# Patient Record
Sex: Male | Born: 1980 | Race: White | Hispanic: No | Smoking: Current every day smoker
Health system: Southern US, Community
[De-identification: ages and names within clinical notes are randomized; demographics above are authoritative.]

## PROBLEM LIST (undated history)

## (undated) DIAGNOSIS — R569 Unspecified convulsions: Secondary | ICD-10-CM

## (undated) DIAGNOSIS — I10 Essential (primary) hypertension: Secondary | ICD-10-CM

## (undated) DIAGNOSIS — C801 Malignant (primary) neoplasm, unspecified: Secondary | ICD-10-CM

## (undated) DIAGNOSIS — R51 Headache: Secondary | ICD-10-CM

## (undated) DIAGNOSIS — M24419 Recurrent dislocation, unspecified shoulder: Secondary | ICD-10-CM

## (undated) HISTORY — DX: Headache: R51

## (undated) HISTORY — PX: BRAIN TUMOR EXCISION: SHX577

## (undated) HISTORY — PX: ABDOMINAL SURGERY: SHX537

---

## 2001-03-02 ENCOUNTER — Emergency Department (HOSPITAL_COMMUNITY): Admission: EM | Admit: 2001-03-02 | Discharge: 2001-03-02 | Payer: Self-pay | Admitting: Emergency Medicine

## 2001-03-02 ENCOUNTER — Encounter: Payer: Self-pay | Admitting: Emergency Medicine

## 2006-12-12 ENCOUNTER — Emergency Department (HOSPITAL_COMMUNITY): Admission: EM | Admit: 2006-12-12 | Discharge: 2006-12-12 | Payer: Self-pay | Admitting: Emergency Medicine

## 2011-09-08 ENCOUNTER — Encounter (HOSPITAL_COMMUNITY): Payer: Self-pay | Admitting: Emergency Medicine

## 2011-09-08 ENCOUNTER — Emergency Department (HOSPITAL_COMMUNITY)
Admission: EM | Admit: 2011-09-08 | Discharge: 2011-09-08 | Disposition: A | Discharge status: Home / Self Care | Payer: Self-pay | Attending: Emergency Medicine | Admitting: Emergency Medicine

## 2011-09-08 DIAGNOSIS — I10 Essential (primary) hypertension: Secondary | ICD-10-CM | POA: Insufficient documentation

## 2011-09-08 DIAGNOSIS — M62838 Other muscle spasm: Secondary | ICD-10-CM | POA: Insufficient documentation

## 2011-09-08 DIAGNOSIS — Z85841 Personal history of malignant neoplasm of brain: Secondary | ICD-10-CM | POA: Insufficient documentation

## 2011-09-08 DIAGNOSIS — F17200 Nicotine dependence, unspecified, uncomplicated: Secondary | ICD-10-CM | POA: Insufficient documentation

## 2011-09-08 DIAGNOSIS — M25519 Pain in unspecified shoulder: Secondary | ICD-10-CM | POA: Insufficient documentation

## 2011-09-08 HISTORY — DX: Recurrent dislocation, unspecified shoulder: M24.419

## 2011-09-08 HISTORY — DX: Unspecified convulsions: R56.9

## 2011-09-08 HISTORY — DX: Essential (primary) hypertension: I10

## 2011-09-08 HISTORY — DX: Malignant (primary) neoplasm, unspecified: C80.1

## 2011-09-08 MED ORDER — HYDROCODONE-ACETAMINOPHEN 5-325 MG PO TABS
1.0000 | ORAL_TABLET | Freq: Once | ORAL | Status: AC
  Administered 2011-09-08: 1 via ORAL
  Filled 2011-09-08: qty 1

## 2011-09-08 MED ORDER — IBUPROFEN 800 MG PO TABS: 800.0000 mg | ORAL_TABLET | Freq: Three times a day (TID) | ORAL | Status: AC | PRN

## 2011-09-08 MED ORDER — HYDROCODONE-ACETAMINOPHEN 5-325 MG PO TABS: ORAL_TABLET | ORAL | Status: AC

## 2011-09-08 MED ORDER — CYCLOBENZAPRINE HCL 10 MG PO TABS: 10.0000 mg | ORAL_TABLET | Freq: Two times a day (BID) | ORAL | Status: AC | PRN

## 2011-09-08 MED ORDER — CYCLOBENZAPRINE HCL 10 MG PO TABS
10.0000 mg | ORAL_TABLET | Freq: Once | ORAL | Status: AC
  Administered 2011-09-08: 10 mg via ORAL
  Filled 2011-09-08: qty 1

## 2011-09-08 NOTE — ED Notes (Signed)
Patient states that he is having left shoulder pain. States he feels a knot in left shoulder. Also states he has a shooting pain into neck and down back. States has dislocated same shoulder multiple times before from seizures. Reports last seizure was yesterday.

## 2011-09-08 NOTE — ED Provider Notes (Signed)
History     CSN: [id number]  Arrival date & time 09/08/11  1901   First MD Initiated Contact with Patient 09/08/11 1921      Chief Complaint  Patient presents with  . Shoulder Pain    (Consider location/radiation/quality/duration/timing/severity/associated sxs/prior treatment) HPI Comments: Patient working outside approx 3 hrs ago -- noted to have soreness in left anterior chest which worsened and developed a 'knot'. Patient has h/o shoulder dislocations and was worried. Pain radiates to left posterior neck and shoulder. No vision change. No extremity weakness or tingling. Pain is worsened with movement. It feels like a 'crick in his neck but in my shoulder". No treatments prior. Nothing makes symptoms better. Onset acute.   Patient is a 31 y.o. male presenting with shoulder pain. The history is provided by the patient.  Shoulder Pain This is a new problem. The current episode started today. The problem occurs constantly. The problem has been unchanged. Associated symptoms include chest pain (Chest wall pain) and neck pain. Pertinent negatives include no headaches, numbness or weakness. Exacerbated by: movement. He has tried nothing for the symptoms.    Past Medical History  Diagnosis Date  . Seizures   . Cancer     brain tumor  . Shoulder dislocation, recurrent   . Hypertension     Past Surgical History  Procedure Date  . Brain tumor excision   . Abdominal surgery     at age of [age]    History reviewed. No pertinent family history.  History  Substance Use Topics  . Smoking status: Current Everyday Smoker  . Smokeless tobacco: Not on file  . Alcohol Use: Yes     occasional      Review of Systems  Constitutional: Negative for activity change.  HENT: Positive for neck pain.   Respiratory: Negative for shortness of breath.   Cardiovascular: Positive for chest pain (Chest wall pain).  Musculoskeletal: Positive for back pain.  Skin: Negative for wound.    Neurological: Negative for weakness, numbness and headaches.    Allergies  Erythromycin  Home Medications  No current outpatient prescriptions on file.  BP 146/105  Pulse 94  Temp(Src) 98.1 F (36.7 C) (Oral)  Resp 14  Ht 6' (1.829 m)  Wt 191 lb (86.637 kg)  BMI 25.90 kg/m2  SpO2 99%  Physical Exam  Nursing note and vitals reviewed. Constitutional: He appears well-developed and well-nourished.  HENT:  Head: Normocephalic and atraumatic.  Eyes: Conjunctivae are normal.  Neck: Normal range of motion. Neck supple.  Cardiovascular: Normal rate and regular rhythm.   No murmur heard. Pulmonary/Chest: Effort normal and breath sounds normal. No respiratory distress. He exhibits tenderness.    Musculoskeletal: He exhibits tenderness (L anterior chest wall). He exhibits no edema.  Neurological: He is alert.  Skin: Skin is warm and dry.  Psychiatric: He has a normal mood and affect.    ED Course  Procedures (including critical care time)  Labs Reviewed - No data to display No results found.   1. Muscle spasm     7:40 PM Patient seen and examined. Will treat for muscle spasm.    Vital signs reviewed and are as follows: Filed Vitals:   09/08/11 1913  BP: 146/105  Pulse: 94  Temp: 98.1 F (36.7 C)  Resp: 14   Patient counseled on use of narcotic pain medications. Counseled not to combine these medications with others containing tylenol. Urged not to drink alcohol, drive, or perform any other activities that requires  focus while taking these medications. The patient verbalizes understanding and agrees with the plan.  Patient counseled on proper use of muscle relaxant medication.  They were told not to drink alcohol, drive any vehicle, or do any dangerous activities while taking this medication.  Patient verbalized understanding.    MDM  Palpable spasm just inferior to L mid-clavicle. No concern for cardiac or pulmonary etiology of pain. Will treat conservatively.  Neck and back pain seems to be referred pain.         Renne Crigler, [place] 09/10/11 1555

## 2011-09-08 NOTE — Discharge Instructions (Signed)
Please read and follow all provided instructions.  Your diagnoses today include:  1. Muscle spasm     Tests performed today include:  Vital signs - see below for your results today  Medications prescribed:   Vicodin (hydrocodone/acetaminophen) - narcotic pain medication  You have been prescribed narcotic pain medication such as Vicodin or Percocet: DO NOT drive or perform any activities that require you to be awake and alert because this medicine can make you drowsy. BE VERY CAREFUL not to take multiple medicines containing Tylenol (also called acetaminophen). Doing so can lead to an overdose which can damage your liver and cause liver failure and possibly death.    Flexeril (cyclobenzaprine) - muscle relaxer medication  You have been prescribed a muscle relaxer medication such as Robaxin, Flexeril, or Valium: DO NOT drive or perform any activities that require you to be awake and alert because this medicine can make you drowsy.    Ibuprofen - anti-inflammatory pain medication  Do not exceed 800mg  ibuprofen every 8 hours  You have been prescribed an anti-inflammatory medication or NSAID. Take with food. Take smallest effective dose for the shortest duration needed for your pain. Stop taking if you experience stomach pain or vomiting.   Take any prescribed medications only as directed.  Home care instructions:   Follow any educational materials contained in this packet  Please rest, use ice or heat on your back for the next several days  Do not lift, push, pull anything more than 10 pounds for the several days  Follow-up instructions: Please follow-up with your primary care provider in the next 1 week for further evaluation of your symptoms. If you do not have a primary care doctor -- see below for referral information.   Return instructions:  SEEK IMMEDIATE MEDICAL ATTENTION IF YOU HAVE:  New numbness, tingling, weakness, or problem with the use of your arms or  legs  Severe back pain not relieved with medications  Loss control of your bowels or bladder  Increasing pain in any areas of the body (such as chest or abdominal pain)  Shortness of breath, dizziness, or fainting.   Worsening nausea (feeling sick to your stomach), vomiting, fever, or sweats  Any other emergent concerns regarding your health   Additional Information:  Your vital signs today were: BP 146/105  Pulse 94  Temp(Src) 98.1 F (36.7 C) (Oral)  Resp 14  Ht 6' (1.829 m)  Wt 191 lb (86.637 kg)  BMI 25.90 kg/m2  SpO2 99% If your blood pressure (BP) was elevated above 135/85 this visit, please have this repeated by your doctor within one month. -------------- No Primary Care Doctor Call [facility]  [phone] Other agencies that provide inexpensive medical care    [facility]  [phone]    [facility]  [phone]    [facility]  [phone]    [facility]  [phone]    [facility]  [phone]    [facility]  [phone] -------------- RESOURCE GUIDE:  Dental Problems  Patients with Medicaid: [facility] [phone] [address]                                            [phone] [address]  Phone:  [phone]                                                   Phone:  [phone]  If unable to pay or uninsured, contact:  [facility] or [facility]. to become qualified for the adult dental clinic.  Chronic Pain Problems Contact [facility]  [phone] Patients need to be referred by their primary care doctor.  Insufficient Money for Medicine Contact [employer]:  call "211" or [facility] [phone].  Psychological Services [facility]  [phone] [facility]  [phone] [facility]   [phone] (emergency services [phone])  Substance Abuse Resources [facility]   [phone] [facility] [phone] [facility] [place] [phone] Floydene Flock [phone] Residential & Outpatient Substance Abuse Program  [phone]  Abuse/Neglect [facility] Child Abuse Hotline 2042429838 [facility] Child Abuse Hotline [phone] (After Hours)  Emergency Shelter [facility] [phone]  M[facility] Homes [facility] [place] [facility] [phone] [facility] [phone]  [facility] Resources  [facility]     [employer]                          [facility] Dept. [address] [place]                       [address]      [address] [place] [address]  Blondell Reveal Phone:  [phone]                                   Phone:  [phone]                 Phone:  [phone]  [facility] Phone:  [phone]  [facility] Child Abuse Hotline [phone] [phone] (After Hours)

## 2011-09-10 NOTE — ED Provider Notes (Signed)
Medical screening examination/treatment/procedure(s) were performed by non-physician practitioner and as supervising physician I was immediately available for consultation/collaboration. Antoneo Ghrist, MD, FACEP   Ziyonna Christner L Sharah Finnell, MD 09/10/11 2108 

## 2012-01-27 ENCOUNTER — Other Ambulatory Visit: Payer: Self-pay | Admitting: Family Medicine

## 2012-01-27 DIAGNOSIS — R1011 Right upper quadrant pain: Secondary | ICD-10-CM

## 2012-01-29 ENCOUNTER — Other Ambulatory Visit: Payer: Self-pay

## 2012-02-01 ENCOUNTER — Ambulatory Visit
Admission: RE | Admit: 2012-02-01 | Discharge: 2012-02-01 | Disposition: A | Discharge status: Home / Self Care | Payer: Medicaid Other | Source: Ambulatory Visit | Attending: Family Medicine | Admitting: Family Medicine

## 2012-02-01 DIAGNOSIS — R1011 Right upper quadrant pain: Secondary | ICD-10-CM

## 2012-02-12 ENCOUNTER — Encounter: Payer: Self-pay | Admitting: Gastroenterology

## 2012-02-23 ENCOUNTER — Ambulatory Visit: Payer: Medicaid Other | Admitting: Gastroenterology

## 2012-04-21 ENCOUNTER — Telehealth: Payer: Self-pay | Admitting: Gastroenterology

## 2012-04-21 NOTE — Telephone Encounter (Signed)
Message copied by Arna Snipe on Thu Apr 21, 2012  4:22 PM ------      Message from: Ok Anis A      Created: Tue Feb 23, 2012  9:56 AM                   ----- Message -----         From: Mardella Layman, MD         Sent: 02/23/2012   9:28 AM           To: Arline Asp, CMA            This patient needs to be charge for no show please do not put back on my schedule

## 2012-07-12 ENCOUNTER — Encounter: Payer: Self-pay | Admitting: Diagnostic Neuroimaging

## 2012-07-12 ENCOUNTER — Ambulatory Visit (INDEPENDENT_AMBULATORY_CARE_PROVIDER_SITE_OTHER): Payer: Medicaid Other | Admitting: Diagnostic Neuroimaging

## 2012-07-12 VITALS — BP 150/106 | HR 80 | Temp 97.8°F | Ht 73.0 in | Wt 201.0 lb

## 2012-07-12 DIAGNOSIS — R519 Headache, unspecified: Secondary | ICD-10-CM | POA: Insufficient documentation

## 2012-07-12 DIAGNOSIS — R51 Headache: Secondary | ICD-10-CM

## 2012-07-12 DIAGNOSIS — C719 Malignant neoplasm of brain, unspecified: Secondary | ICD-10-CM

## 2012-07-12 DIAGNOSIS — R569 Unspecified convulsions: Secondary | ICD-10-CM

## 2012-07-12 MED ORDER — AMITRIPTYLINE HCL 25 MG PO TABS: 25.0000 mg | ORAL_TABLET | Freq: Every day | ORAL | Status: DC

## 2012-07-12 NOTE — Progress Notes (Signed)
[facility]  PATIENT: Benjamin Stephenson DOB: 05/16/1980  REFERRING CLINICIAN:  HISTORY FROM: patient REASON FOR VISIT: follow up   HISTORICAL  CHIEF COMPLAINT:  Chief Complaint  Patient presents with  . Headache    HISTORY OF PRESENT ILLNESS:   UPDATE 07/12/12: Since last visit, still having headaches, poor sleep, decreased mood. No grand mal seizures. Still with intermittent "jittery" events 20x per day in head, arms and legs. No LOC. No benefit with TPX for or increase LEV dosing for sz control.  PRIOR HPI (03/09/12): 32 year old right-handed male with history of left frontal temporal glioma, status post resection, here for evaluation of seizure.  February 2009, patient had right-sided toothache and right-sided headache. Get evaluation with CT scan of the head which showed a left frontotemporal mass which was incidentally found. This was followed over time with serial imaging. August 2009 had first seizure of his life with generalized convulsions, foaming at the mouth, loss of consciousness. Initially was treated with Dilantin but this aggravated his mood. He was then switched to Keppra 500 mg twice a day. He continued to have seizures one per month.  Ultimately serial imaging study showed increase in size of the mass. He underwent craniotomy and gross total tumor resection 05/13/2011. He had post operative aphasia and right hand weakness. Symptoms gradually improved over time. He had no further convulsive seizures. He has had other episodes consisting of intermittent twitching of his left neck, legs, sometimes resulting in him falling down. He never loses consciousness. No tongue biting or incontinence. He feels that these are exacerbated by stress.  Also patient continuing to have significant headaches, sometimes nausea.   REVIEW OF SYSTEMS: Full 14 system review of systems performed and notable only for fatigue weight gain spinning sensation snoring wheezing  double vision blurred vision memory loss confusion headache slurred speech 2 minutes sleep decreased energy disinterest in activities racing thoughts.  ALLERGIES: Allergies  Allergen Reactions  . Erythromycin Nausea And Vomiting    HOME MEDICATIONS: Outpatient Prescriptions Prior to Visit  Medication Sig Dispense Refill  . levETIRAcetam (KEPPRA) 500 MG tablet Take 1,000 mg by mouth 2 (two) times daily.       Marland Kitchen losartan-hydrochlorothiazide (HYZAAR) 100-12.5 MG per tablet Take 1 tablet by mouth daily.      Marland Kitchen omeprazole (PRILOSEC) 40 MG capsule Take 40 mg by mouth daily.       No facility-administered medications prior to visit.    PAST MEDICAL HISTORY: Past Medical History  Diagnosis Date  . Seizures   . Cancer     brain tumor  . Shoulder dislocation, recurrent   . Hypertension   . Headache     PAST SURGICAL HISTORY: Past Surgical History  Procedure Laterality Date  . Brain tumor excision      left fronto-temporal glioma, s/p resection at [facility] (Dr. Rennis Harding)  . Abdominal surgery      at age of [age]    FAMILY HISTORY: Family History  Problem Relation Age of Onset  . Diabetes    . Heart Problems    . Anxiety disorder    . Asthma    . Cancer    . Hypertension    . Multiple sclerosis Father     SOCIAL HISTORY:  History   Social History  . Marital Status: Divorced    Spouse Name: N/A    Number of Children: N/A  . Years of Education: N/A   Occupational History  . unemployed    Social  History Main Topics  . Smoking status: Current Every Day Smoker -- 1.00 packs/day  . Smokeless tobacco: Not on file  . Alcohol Use: Yes     Comment: occasional ( beer 2-3 times a week)  . Drug Use: No  . Sexually Active: Not on file   Other Topics Concern  . Not on file   Social History Narrative  . No narrative on file     PHYSICAL EXAM  Filed Vitals:   07/12/12 1522  BP: 150/106  Pulse: 80  Temp: 97.8 F (36.6 C)  TempSrc: Oral  Height: 6\' 1"  (1.854 m)    Weight: 201 lb (91.173 kg)   Body mass index is 26.52 kg/(m^2).  EXAM: General: Patient is awake, alert and in no acute distress.  Well developed and groomed. Neck: Neck is supple. Cardiovascular: No carotid artery bruits.  Heart is regular rate and rhythm with no murmurs.  Neurologic Exam  Mental Status: Awake, alert. Language is fluent and comprehension intact. Cranial Nerves: No evidence of papilledema on funduscopic exam.  Pupils are equal and reactive to light.  Visual fields are full to confrontation.  Conjugate eye movements are full and symmetric.  Facial sensation and strength are symmetric.  Hearing is intact.  Palate elevated symmetrically and uvula is midline.  Shoulder shrug is symmetric.  Tongue is midline. Motor: Normal bulk and tone.  Full strength in the upper and lower extremities.  No pronator drift. Sensory: Intact and symmetric to light touch, pinprick, temperature, vibration; DECR IN RIGHT FOOT. Coordination: No ataxia or dysmetria on finger-nose or rapid alternating movement testing. Gait and Station: Narrow based gait, able to walk on heels and toes.  Tandem gait is stable.  Romberg is negative. Reflexes: Deep tendon reflexes in the upper and lower extremity are present and symmetric.   DIAGNOSTIC DATA (LABS, IMAGING, TESTING) - I reviewed patient records, labs, notes, testing and imaging myself where available.  No results found for this basename: WBC, HGB, HCT, MCV, PLT   No results found for this basename: na, k, cl, co2, glucose, bun, creatinine, calcium, prot, albumin, ast, alt, alkphos, bilitot, gfrnonaa, gfraa   No results found for this basename: CHOL, HDL, LDLCALC, LDLDIRECT, TRIG, CHOLHDL   No results found for this basename: HGBA1C   No results found for this basename: VITAMINB12   No results found for this basename: TSH     ASSESSMENT AND PLAN  32 y.o. year old male  has a past medical history of Seizures; Cancer; Shoulder dislocation,  recurrent; Hypertension; and Headache. 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 Continue LEV 1000mg  BID for seizure control 2. Refer to [facility]G at [facility] for seizure /"jittery spell" eval 3. Trial of amitriptyline for HA and insomnia 4. f/u with NSGY clinic re: MRI scans  Orders Placed This Encounter  Procedures  . Overnight EEG with video     Meds ordered this encounter  Medications  . amitriptyline (ELAVIL) 25 MG tablet    Sig: Take 1 tablet (25 mg total) by mouth at bedtime.    Dispense:  30 tablet    Refill:  6     Suanne Marker, MD 07/12/2012, 3:46 PM Certified in Neurology, Neurophysiology and Neuroimaging  [facility] [address], [address] [place], [place] [address] [phone]

## 2012-07-12 NOTE — Patient Instructions (Signed)
Drink plenty of water. Gradually increase physical activity (walking, stretching, pushups, squats).  Continue antiseizure medication.  Start amitriptyline 25 mg at night for headaches and sleep.

## 2012-08-11 ENCOUNTER — Ambulatory Visit: Payer: Medicaid Other | Admitting: Family Medicine

## 2012-09-12 ENCOUNTER — Encounter (HOSPITAL_COMMUNITY): Payer: Self-pay

## 2012-09-12 ENCOUNTER — Emergency Department (HOSPITAL_COMMUNITY)
Admission: EM | Admit: 2012-09-12 | Discharge: 2012-09-12 | Disposition: A | Discharge status: Home / Self Care | Payer: Medicaid Other | Attending: Emergency Medicine | Admitting: Emergency Medicine

## 2012-09-12 ENCOUNTER — Emergency Department (HOSPITAL_COMMUNITY): Payer: Medicaid Other

## 2012-09-12 DIAGNOSIS — Z86011 Personal history of benign neoplasm of the brain: Secondary | ICD-10-CM | POA: Insufficient documentation

## 2012-09-12 DIAGNOSIS — R071 Chest pain on breathing: Secondary | ICD-10-CM | POA: Insufficient documentation

## 2012-09-12 DIAGNOSIS — R0789 Other chest pain: Secondary | ICD-10-CM

## 2012-09-12 DIAGNOSIS — F17200 Nicotine dependence, unspecified, uncomplicated: Secondary | ICD-10-CM | POA: Insufficient documentation

## 2012-09-12 DIAGNOSIS — R0602 Shortness of breath: Secondary | ICD-10-CM | POA: Insufficient documentation

## 2012-09-12 DIAGNOSIS — Z79899 Other long term (current) drug therapy: Secondary | ICD-10-CM | POA: Insufficient documentation

## 2012-09-12 DIAGNOSIS — G40909 Epilepsy, unspecified, not intractable, without status epilepticus: Secondary | ICD-10-CM | POA: Insufficient documentation

## 2012-09-12 DIAGNOSIS — Z87828 Personal history of other (healed) physical injury and trauma: Secondary | ICD-10-CM | POA: Insufficient documentation

## 2012-09-12 DIAGNOSIS — I10 Essential (primary) hypertension: Secondary | ICD-10-CM | POA: Insufficient documentation

## 2012-09-12 MED ORDER — IBUPROFEN 800 MG PO TABS
800.0000 mg | ORAL_TABLET | Freq: Once | ORAL | Status: DC
  Filled 2012-09-12: qty 1

## 2012-09-12 NOTE — ED Notes (Signed)
Discharge instructions reviewed with pt, questions answered. Pt verbalized understanding.  

## 2012-09-12 NOTE — ED Notes (Signed)
Pt refused medication, stated he can take that at home

## 2012-09-12 NOTE — ED Notes (Signed)
Pt c/o pain to left ribcage, states he has seizures regularly and thinks he may have had a seizure and injured his ribs.  Pt reports pain worse with palpation and deep breathing

## 2012-09-12 NOTE — ED Provider Notes (Signed)
History     CSN: [id number]  Arrival date & time 09/12/12  0004   First MD Initiated Contact with Patient 09/12/12 0131      Chief Complaint  Patient presents with  . Chest Pain  . ribs     (Consider location/radiation/quality/duration/timing/severity/associated sxs/prior treatment) HPI HPI Comments: Benjamin Stephenson is a 32 y.o. male who presents to the Emergency Department complaining of left chest pain after having a seizure. He feels he may have broken a rib. Hurts when he takes a deep breath or touches the area. Denies fever, chills, shortness of breath.   PCP Dr. Tanya Nones  Past Medical History  Diagnosis Date  . Seizures   . Cancer     brain tumor  . Shoulder dislocation, recurrent   . Hypertension   . [id number](478.2)     Past Surgical History  Procedure Laterality Date  . Brain tumor excision      left fronto-temporal glioma, s/p resection at [facility] (Dr. Rennis Harding)  . Abdominal surgery      at age of [age]    Family History  Problem Relation Age of Onset  . Diabetes    . Heart Problems    . Anxiety disorder    . Asthma    . Cancer    . Hypertension    . Multiple sclerosis Father     History  Substance Use Topics  . Smoking status: Current Every Day Smoker -- 1.00 packs/day  . Smokeless tobacco: Not on file  . Alcohol Use: Yes     Comment: occasional ( beer 2-3 times a week)      Review of Systems  Constitutional: Negative for fever.       10 Systems reviewed and are negative for acute change except as noted in the HPI.  HENT: Negative for congestion.   Eyes: Negative for discharge and redness.  Respiratory: Positive for shortness of breath. Negative for cough.   Cardiovascular: Negative for chest pain.  Gastrointestinal: Negative for vomiting and abdominal pain.  Musculoskeletal: Negative for back pain.  Skin: Negative for rash.  Neurological: Negative for syncope, numbness and headaches.  Psychiatric/Behavioral:       No behavior change.     Allergies  Erythromycin  Home Medications   Current Outpatient Rx  Name  Route  Sig  Dispense  Refill  . levETIRAcetam (KEPPRA) 500 MG tablet   Oral   Take 1,000 mg by mouth 2 (two) times daily.          Marland Kitchen losartan-hydrochlorothiazide (HYZAAR) 100-12.5 MG per tablet   Oral   Take 1 tablet by mouth daily.         Marland Kitchen amitriptyline (ELAVIL) 25 MG tablet   Oral   Take 1 tablet (25 mg total) by mouth at bedtime.   30 tablet   6     Ht 6' (1.829 m)  Wt 200 lb (90.719 kg)  BMI 27.12 kg/m2  Physical Exam  Nursing note and vitals reviewed. Constitutional: He appears well-developed and well-nourished.  Awake, alert, nontoxic appearance.  HENT:  Head: Normocephalic and atraumatic.  Eyes: EOM are normal. Pupils are equal, round, and reactive to light.  Neck: Normal range of motion. Neck supple.  Cardiovascular: Normal rate and intact distal pulses.   Pulmonary/Chest: Effort normal and breath sounds normal. He exhibits no tenderness.  Pain with palpation over the left 11 rib at the anterior axillary line. No bruising noted. No deformity  Abdominal: Soft. There is no tenderness. There  is no rebound.  Musculoskeletal: He exhibits no tenderness.  Baseline ROM, no obvious new focal weakness.  Neurological:  Mental status and motor strength appears baseline for patient and situation.  Skin: No rash noted.  Psychiatric: He has a normal mood and affect.    ED Course  Procedures (including critical care time)  Labs Reviewed - No data to display Dg Chest 2 View  09/12/2012   *RADIOLOGY REPORT*  Clinical Data: Left-sided rib pain.  CHEST - 2 VIEW  Comparison: No priors.  Findings: There are linear opacities throughout the lung bases bilaterally which appear to represent areas of plate-like atelectasis, although these may be areas of scarring.  No acute consolidative airspace disease.  No pleural effusions.  Pulmonary vasculature and the cardiomediastinal silhouette are within  normal limits.  IMPRESSION: Linear opacities throughout the lung bases bilaterally may represent areas of plate-like atelectasis or chronic scarring.   Original Report Authenticated By: Trudie Reed, M.D.     1. Chest wall pain       MDM  Patient with a seizure disorder who had a seizure and now has left sided chest wall pain. He is not aware of hitting his chest on anything. Xray does not reveal any broken ribs. Area over left chest sore with palpation. Offered ibuprofen which the patient refused. Pt stable in ED with no significant deterioration in condition.The patient appears reasonably screened and/or stabilized for discharge and I doubt any other medical condition or other [facility] requiring further screening, evaluation, or treatment in the ED at this time prior to discharge.  MDM Reviewed: nursing note and vitals Interpretation: x-ray           Nicoletta Dress. Colon Branch, MD 09/12/12 [phone]

## 2012-12-01 ENCOUNTER — Inpatient Hospital Stay (HOSPITAL_COMMUNITY)
Admission: EM | Admit: 2012-12-01 | Discharge: 2012-12-03 | DRG: 603 | Disposition: A | Discharge status: Home / Self Care | Payer: Medicaid Other | Attending: Internal Medicine | Admitting: Internal Medicine

## 2012-12-01 ENCOUNTER — Encounter (HOSPITAL_COMMUNITY): Payer: Self-pay | Admitting: *Deleted

## 2012-12-01 ENCOUNTER — Emergency Department (HOSPITAL_COMMUNITY): Payer: Medicaid Other

## 2012-12-01 DIAGNOSIS — L02419 Cutaneous abscess of limb, unspecified: Secondary | ICD-10-CM

## 2012-12-01 DIAGNOSIS — L02219 Cutaneous abscess of trunk, unspecified: Principal | ICD-10-CM | POA: Diagnosis present

## 2012-12-01 DIAGNOSIS — R51 Headache: Secondary | ICD-10-CM

## 2012-12-01 DIAGNOSIS — L02415 Cutaneous abscess of right lower limb: Secondary | ICD-10-CM

## 2012-12-01 DIAGNOSIS — C719 Malignant neoplasm of brain, unspecified: Secondary | ICD-10-CM

## 2012-12-01 DIAGNOSIS — F17200 Nicotine dependence, unspecified, uncomplicated: Secondary | ICD-10-CM | POA: Diagnosis present

## 2012-12-01 DIAGNOSIS — Z720 Tobacco use: Secondary | ICD-10-CM

## 2012-12-01 DIAGNOSIS — Z85841 Personal history of malignant neoplasm of brain: Secondary | ICD-10-CM

## 2012-12-01 DIAGNOSIS — G40909 Epilepsy, unspecified, not intractable, without status epilepticus: Secondary | ICD-10-CM | POA: Diagnosis present

## 2012-12-01 DIAGNOSIS — R000 Tachycardia, unspecified: Secondary | ICD-10-CM | POA: Diagnosis present

## 2012-12-01 DIAGNOSIS — I10 Essential (primary) hypertension: Secondary | ICD-10-CM

## 2012-12-01 DIAGNOSIS — R569 Unspecified convulsions: Secondary | ICD-10-CM

## 2012-12-01 LAB — CBC WITH DIFFERENTIAL/PLATELET
Eosinophils Relative: 0 % (ref 0–5)
HCT: 46.8 % (ref 39.0–52.0)
Hemoglobin: 16.2 g/dL (ref 13.0–17.0)
Lymphocytes Relative: 12 % (ref 12–46)
MCV: 83 fL (ref 78.0–100.0)
Monocytes Absolute: 2 10*3/uL — ABNORMAL HIGH (ref 0.1–1.0)
Monocytes Relative: 13 % — ABNORMAL HIGH (ref 3–12)
Neutro Abs: 11.3 10*3/uL — ABNORMAL HIGH (ref 1.7–7.7)
WBC: 15.1 10*3/uL — ABNORMAL HIGH (ref 4.0–10.5)

## 2012-12-01 LAB — TROPONIN I: Troponin I: <0.3 ng/mL (ref <0.30)

## 2012-12-01 LAB — COMPREHENSIVE METABOLIC PANEL
AST: 31 U/L (ref 0–37)
BUN: 10 mg/dL (ref 6–23)
CO2: 24 mEq/L (ref 19–32)
Chloride: 96 mEq/L (ref 96–112)
Creatinine, Ser: 0.97 mg/dL (ref 0.50–1.35)
GFR calc Af Amer: >90 mL/min (ref >90)
GFR calc non Af Amer: >90 mL/min (ref >90)
Glucose, Bld: 117 mg/dL — ABNORMAL HIGH (ref 70–99)
Total Bilirubin: 0.8 mg/dL (ref 0.3–1.2)

## 2012-12-01 LAB — D-DIMER, QUANTITATIVE: D-Dimer, Quant: 0.78 ug/mL-FEU — ABNORMAL HIGH (ref 0.00–0.48)

## 2012-12-01 MED ORDER — VANCOMYCIN HCL IN DEXTROSE 1-5 GM/200ML-% IV SOLN
1000.0000 mg | Freq: Once | INTRAVENOUS | Status: AC
Start: 2012-12-01 — End: 2012-12-01
  Administered 2012-12-01: 1000 mg via INTRAVENOUS
  Filled 2012-12-01: qty 200

## 2012-12-01 MED ORDER — VANCOMYCIN HCL IN DEXTROSE 1-5 GM/200ML-% IV SOLN
1000.0000 mg | Freq: Three times a day (TID) | INTRAVENOUS | Status: DC
  Administered 2012-12-02 (×2): 1000 mg via INTRAVENOUS
  Filled 2012-12-01 (×11): qty 200

## 2012-12-01 MED ORDER — SODIUM CHLORIDE 0.9 % IV BOLUS (SEPSIS)
1000.0000 mL | Freq: Once | INTRAVENOUS | Status: AC
  Administered 2012-12-01: 1000 mL via INTRAVENOUS

## 2012-12-01 MED ORDER — DEXTROSE 5 % IV SOLN
2.0000 g | Freq: Three times a day (TID) | INTRAVENOUS | Status: DC
  Administered 2012-12-02 – 2012-12-03 (×4): 2 g via INTRAVENOUS
  Filled 2012-12-01 (×11): qty 2

## 2012-12-01 MED ORDER — FLEET ENEMA 7-19 GM/118ML RE ENEM: 1.0000 | ENEMA | Freq: Once | RECTAL | Status: AC | PRN

## 2012-12-01 MED ORDER — TRAZODONE HCL 50 MG PO TABS: 50.0000 mg | ORAL_TABLET | Freq: Every evening | ORAL | Status: DC | PRN

## 2012-12-01 MED ORDER — ONDANSETRON HCL 4 MG/2ML IJ SOLN: 4.0000 mg | INTRAMUSCULAR | Status: DC | PRN

## 2012-12-01 MED ORDER — CEFEPIME HCL 2 G IJ SOLR
2.0000 g | Freq: Once | INTRAMUSCULAR | Status: AC
  Administered 2012-12-01: 2 g via INTRAVENOUS
  Filled 2012-12-01: qty 2

## 2012-12-01 MED ORDER — ACETAMINOPHEN 325 MG PO TABS: 650.0000 mg | ORAL_TABLET | ORAL | Status: DC | PRN

## 2012-12-01 MED ORDER — CHLORHEXIDINE GLUCONATE 4 % EX LIQD
1.0000 "application " | Freq: Once | CUTANEOUS | Status: AC
  Administered 2012-12-02: 1 via TOPICAL
  Filled 2012-12-01: qty 15

## 2012-12-01 MED ORDER — OXYCODONE HCL 5 MG PO TABS
5.0000 mg | ORAL_TABLET | ORAL | Status: DC | PRN
  Administered 2012-12-02: 5 mg via ORAL
  Filled 2012-12-01 (×2): qty 1

## 2012-12-01 MED ORDER — POTASSIUM CHLORIDE IN NACL 20-0.9 MEQ/L-% IV SOLN
INTRAVENOUS | Status: DC
  Administered 2012-12-01: 1000 mL via INTRAVENOUS
  Administered 2012-12-02: 12:00:00 via INTRAVENOUS
  Administered 2012-12-02: 1000 mL via INTRAVENOUS
  Administered 2012-12-03: 01:00:00 via INTRAVENOUS

## 2012-12-01 MED ORDER — HYDROCHLOROTHIAZIDE 12.5 MG PO CAPS
12.5000 mg | ORAL_CAPSULE | Freq: Every day | ORAL | Status: DC
  Administered 2012-12-02 – 2012-12-03 (×2): 12.5 mg via ORAL
  Filled 2012-12-01 (×2): qty 1

## 2012-12-01 MED ORDER — LOSARTAN POTASSIUM 50 MG PO TABS
100.0000 mg | ORAL_TABLET | Freq: Every day | ORAL | Status: DC
  Administered 2012-12-02 – 2012-12-03 (×2): 100 mg via ORAL
  Filled 2012-12-01 (×2): qty 2

## 2012-12-01 MED ORDER — DEXTROSE 5 % IV SOLN
INTRAVENOUS | Status: AC
  Filled 2012-12-01: qty 2

## 2012-12-01 MED ORDER — LOSARTAN POTASSIUM-HCTZ 100-12.5 MG PO TABS: 1.0000 | ORAL_TABLET | Freq: Every day | ORAL | Status: DC

## 2012-12-01 MED ORDER — BISACODYL 10 MG RE SUPP: 10.0000 mg | Freq: Every day | RECTAL | Status: DC | PRN

## 2012-12-01 MED ORDER — HYDROMORPHONE HCL PF 1 MG/ML IJ SOLN
0.5000 mg | INTRAMUSCULAR | Status: DC | PRN
  Administered 2012-12-01 – 2012-12-03 (×13): 1 mg via INTRAVENOUS
  Filled 2012-12-01 (×14): qty 1

## 2012-12-01 MED ORDER — NICOTINE 21 MG/24HR TD PT24
21.0000 mg | MEDICATED_PATCH | Freq: Every day | TRANSDERMAL | Status: DC
  Administered 2012-12-01 – 2012-12-03 (×3): 21 mg via TRANSDERMAL
  Filled 2012-12-01 (×3): qty 1

## 2012-12-01 MED ORDER — LEVETIRACETAM 500 MG PO TABS
1000.0000 mg | ORAL_TABLET | Freq: Two times a day (BID) | ORAL | Status: DC
  Administered 2012-12-01 – 2012-12-03 (×4): 1000 mg via ORAL
  Filled 2012-12-01 (×4): qty 2

## 2012-12-01 MED ORDER — ENOXAPARIN SODIUM 40 MG/0.4ML ~~LOC~~ SOLN
40.0000 mg | Freq: Once | SUBCUTANEOUS | Status: AC
  Administered 2012-12-02: 40 mg via SUBCUTANEOUS
  Filled 2012-12-01: qty 0.4

## 2012-12-01 NOTE — ED Provider Notes (Addendum)
CSN: [id number]     Arrival date & time 12/01/12  1537 History  This chart was scribed for Geoffery Lyons, MD by Clydene Laming, ED Scribe. This patient was seen in room APA05/APA05 and the patient's care was started at 3:49PM.    Chief Complaint  Patient presents with  . Chest Pain    The history is provided by the patient. No language interpreter was used.    HPI Comments: Benjamin Stephenson is a 32 y.o. male who presents to the Emergency Department complaining of chest pain described as tightness that began today with associated SOB, dizziness, nausea, fever of 99 and palpitations that he attributes to an infected boil on his inner right thigh that appeared three days agp. Pt states he placed an onion on the boil with no relief. He denies attempting to open the boil at home. Pt also has had cancerous brain tumor removed and abdominal surgery. He states that his h/o seizures was caused by the tumor and he denies having any seizures since the surgery 3 years ago. He denies having chemotherapy or radiation. He states that he follows up with his PCP, last MRI to check for reoccurent CA was 2 months ago.    Past Medical History  Diagnosis Date  . Seizures   . Shoulder dislocation, recurrent   . Hypertension   . Headache(784.0)   . Cancer     brain tumor   Past Surgical History  Procedure Laterality Date  . Brain tumor excision      left fronto-temporal glioma, s/p resection at [facility] (Dr. Rennis Harding)  . Abdominal surgery      at age of [age]   Family History  Problem Relation Age of Onset  . Diabetes    . Heart Problems    . Anxiety disorder    . Asthma    . Cancer    . Hypertension    . Multiple sclerosis Father    History  Substance Use Topics  . Smoking status: Current Every Day Smoker -- 1.00 packs/day  . Smokeless tobacco: Not on file  . Alcohol Use: Yes     Comment: occasional ( beer 2-3 times a week)    Review of Systems  Constitutional: Positive for fever. Negative for chills.   Respiratory: Positive for shortness of breath. Negative for cough.   Cardiovascular: Positive for chest pain and palpitations. Negative for leg swelling.  Gastrointestinal: Positive for nausea. Negative for vomiting.  Skin: Positive for wound ("boil").  Neurological: Positive for dizziness. Negative for seizures.  All other systems reviewed and are negative.    Allergies  Erythromycin  Home Medications   Current Outpatient Rx  Name  Route  Sig  Dispense  Refill  . amitriptyline (ELAVIL) 25 MG tablet   Oral   Take 1 tablet (25 mg total) by mouth at bedtime.   30 tablet   6   . levETIRAcetam (KEPPRA) 500 MG tablet   Oral   Take 1,000 mg by mouth 2 (two) times daily.          Marland Kitchen losartan-hydrochlorothiazide (HYZAAR) 100-12.5 MG per tablet   Oral   Take 1 tablet by mouth daily.          Triage Vitals: BP 132/97  Pulse 135  Temp(Src) 98.6 F (37 C) (Oral)  Resp 22  Ht 6' (1.829 m)  Wt 200 lb (90.719 kg)  BMI 27.12 kg/m2  SpO2 97%  Physical Exam  Nursing note and vitals reviewed. Constitutional: He  is oriented to person, place, and time. He appears well-developed and well-nourished. No distress.  HENT:  Head: Normocephalic and atraumatic.  Eyes: Conjunctivae and EOM are normal.  Neck: Normal range of motion. Neck supple. No tracheal deviation present.  Cardiovascular: Normal rate, regular rhythm and normal heart sounds.   No murmur heard. Pulmonary/Chest: Effort normal and breath sounds normal. No respiratory distress. He has no wheezes. He has no rales.  Abdominal: Soft. Bowel sounds are normal. There is no tenderness.  Musculoskeletal: Normal range of motion. He exhibits no edema.  Neurological: He is alert and oriented to person, place, and time. No cranial nerve deficit.  Skin: Skin is warm and dry.  There is a firm, indurated area to the right inner, upper thigh. There is erythema extending into the mid thigh, exquisitely tender with palpation.    Psychiatric: He has a normal mood and affect. His behavior is normal.    ED Course  Procedures (including critical care time)  DIAGNOSTIC STUDIES: Oxygen Saturation is 97% on RA, normal by my interpretation.    COORDINATION OF CARE: 3:49 PM- Discussed treatment plan with pt at bedside. Pt verbalized understanding and agreement with plan.   Labs Review Labs Reviewed - No data to display Imaging Review No results found.   Date: 12/01/2012  Rate: 122  Rhythm: sinus tachycardia  QRS Axis: normal  Intervals: normal  ST/T Wave abnormalities: normal  Conduction Disutrbances:none  Narrative Interpretation:   Old EKG Reviewed: none available    MDM  No diagnosis found. This patient presents here with weakness, right thigh pain which appears to be a large abscess with surrounding cellulitis. He is tachycardic and has a white count of 15,000. I spoke with Dr. Lovell Sheehan from general surgery and asked him to evaluate the patient. He agrees the patient will require IV antibiotics and admission. He will drain the abscess in the operating room in the morning. He was given vancomycin here in the emergency department and will be admitted to the internal medicine service.  I personally performed the services described in this documentation, which was scribed in my presence. The recorded information has been reviewed and is accurate.     Geoffery Lyons, MD 12/01/12 4098  Geoffery Lyons, MD 12/01/12 [phone]

## 2012-12-01 NOTE — Consult Note (Signed)
Reason for Consult: Right groin abscess Referring Physician: Emergency room  Benjamin Stephenson is an 32 y.o. male.  HPI: Patient is a 32 year old white male white male with a history of brain tumor and seizure disorder who presents to the emergency room with a three-day history of worsening right groin pain. He states he had a swelling just below the groin crease in the upper right thigh that he placed an onion on in hopes of drying out any pus. The swelling has increased in size he now presents the emergency room for further evaluation treatment. He is noted to be tachycardic and complains of some chest tightness.  Past Medical History  Diagnosis Date  . Seizures   . Shoulder dislocation, recurrent   . Hypertension   . Headache(784.0)   . Cancer     brain tumor    Past Surgical History  Procedure Laterality Date  . Brain tumor excision      left fronto-temporal glioma, s/p resection at [facility] (Dr. Rennis Harding)  . Abdominal surgery      at age of [age]    Family History  Problem Relation Age of Onset  . Diabetes    . Heart Problems    . Anxiety disorder    . Asthma    . Cancer    . Hypertension    . Multiple sclerosis Father     Social History:  reports that he has been smoking.  He does not have any smokeless tobacco history on file. He reports that  drinks alcohol. He reports that he does not use illicit drugs.  Allergies:  Allergies  Allergen Reactions  . Erythromycin Nausea And Vomiting    Medications: I have reviewed the patient's current medications.  Results for orders placed during the hospital encounter of 12/01/12 (from the past 48 hour(s))  CBC WITH DIFFERENTIAL     Status: Abnormal   Collection Time    12/01/12  4:14 PM      Result Value Range   WBC 15.1 (*) 4.0 - 10.5 K/uL   RBC 5.64  4.22 - 5.81 MIL/uL   Hemoglobin 16.2  13.0 - 17.0 g/dL   HCT 46.9  62.9 - 52.8 %   MCV 83.0  78.0 - 100.0 fL   MCH 28.7  26.0 - 34.0 pg   MCHC 34.6  30.0 - 36.0 g/dL   RDW 41.3  24.4 -  01.0 %   Platelets 249  150 - 400 K/uL   Neutrophils Relative % 75  43 - 77 %   Neutro Abs 11.3 (*) 1.7 - 7.7 K/uL   Lymphocytes Relative 12  12 - 46 %   Lymphs Abs 1.8  0.7 - 4.0 K/uL   Monocytes Relative 13 (*) 3 - 12 %   Monocytes Absolute 2.0 (*) 0.1 - 1.0 K/uL   Eosinophils Relative 0  0 - 5 %   Eosinophils Absolute 0.0  0.0 - 0.7 K/uL   Basophils Relative 0  0 - 1 %   Basophils Absolute 0.0  0.0 - 0.1 K/uL    No results found.  ROS:  See chart  Blood pressure 132/97, pulse 135, temperature 98.6 F (37 C), temperature source Oral, resp. rate 22, height 6' (1.829 m), weight 90.719 kg (200 lb), SpO2 97.00%. Physical Exam: Pleasant white male who has a tachycardia of 135. He states his chest is tight, but does not appear to be in distress. Extremity examination reveals a hard, indurated, erythematous fluctuant mass at the right groin  crease. No drainage is noted. Some erythema is noted to extend inferior along the right inner thigh.  Assessment/Plan: Impression: Right groin abscess, tachycardia of unknown etiology Plan: Will last the hospitalist to admit the patient do to is multiple medical problems. He will need incision and drainage of the right groin abscess in the operating room. I have temporarily schedule this for tomorrow morning. Would start vancomycin therapy.  Dellamae Rosamilia A 12/01/2012, 5:04 PM

## 2012-12-01 NOTE — Progress Notes (Signed)
ANTIBIOTIC CONSULT NOTE - INITIAL  Pharmacy Consult for Vancomycin & Cefepime Indication: cellulitis  Allergies  Allergen Reactions  . Erythromycin Nausea And Vomiting    Patient Measurements: Height: 6' (182.9 cm) Weight: 200 lb (90.719 kg) IBW/kg (Calculated) : 77.6  Vital Signs: Temp: 99 F (37.2 C) (09/04 1715) Temp src: Oral (09/04 1715) BP: 130/83 mmHg (09/04 1715) Pulse Rate: 109 (09/04 1715) Intake/Output from previous day:   Intake/Output from this shift:    Labs:  Recent Labs  12/01/12 1614  WBC 15.1*  HGB 16.2  PLT 249  CREATININE 0.97   Estimated Creatinine Clearance: 117.8 ml/min (by C-G formula based on Cr of 0.97). No results found for this basename: VANCOTROUGH, VANCOPEAK, VANCORANDOM, GENTTROUGH, GENTPEAK, GENTRANDOM, TOBRATROUGH, TOBRAPEAK, TOBRARND, AMIKACINPEAK, AMIKACINTROU, AMIKACIN,  in the last 72 hours   Microbiology: No results found for this or any previous visit (from the past 720 hour(s)).  Medical History: Past Medical History  Diagnosis Date  . Seizures   . Shoulder dislocation, recurrent   . Hypertension   . Headache(784.0)   . Cancer     brain tumor    Medications:  Scheduled:  . ceFEPime (MAXIPIME) IV  2 g Intravenous Once  . chlorhexidine  1 application Topical Once  . [START ON 12/02/2012] enoxaparin (LOVENOX) injection  40 mg Subcutaneous Once  . [START ON 12/02/2012] losartan  100 mg Oral Daily   And  . [START ON 12/02/2012] hydrochlorothiazide  12.5 mg Oral Daily  . levETIRAcetam  1,000 mg Oral BID  . nicotine  21 mg Transdermal Daily   Assessment: 32 yo M admitted with rt groin abscess.  Planning for I&D tomorrow. WBC elevated.  Renal function at patient's baseline.   Goal of Therapy:  Vancomycin trough level 15-20 mcg/ml  Plan:  1) Cefepime 2gm IV Q8h 2) Vancomycin 1gm IV q8h 3) Check Vancomycin trough at steady state 4) Monitor renal function and cx data   Elson Clan 12/01/2012,10:16 PM

## 2012-12-01 NOTE — ED Notes (Addendum)
Pt co boil on inner thigh x3 days, today co SOB and CP. Chest pain 3/10, inner thigh pain 9/10, pt denies SOB at this time, states it comes and goes.

## 2012-12-01 NOTE — ED Notes (Signed)
Chest pain for 6 hours, pt says he has a "boil " on his rt thigh thinks it has "poisoned his system"

## 2012-12-01 NOTE — H&P (Signed)
[facility] History and Physical  [name]  ZOX:[id number]  DOB: [birth year]   DOA: 12/01/2012   PCP:   Leo Grosser, MD   Chief Complaint:  Redness right thigh x 2 days  HPI: Benjamin Stephenson is a 32 y.o. male.   Young Caucasian gentleman with a history of seizure disorder noted a painful lump in his right upper inner thigh about 2 days ago, self medicated placing raw onions on it, and the area got progressively more swollen red and tender currently presented to the emergency room today with extensive inflammation of the right inner thigh. He has had a surgical consult for planned drainage of abscess and the hospitalist service was called for admission.   his real reason for coming to the emergency room is actually sudden onset of chest tightness and shortness of breath, but that resolved rapidly adjust the tension in the emergency room, without any specific treatment directed at his respiration. He has no swelling below the knee; he has had no chest pain. He does not have a history of bronchitis but he does smoke.  He reports he suffered a seizure disorder for about 6 years for which she takes Keppra; he reports about 10-16 seizures per day; he refers to them as mini seizures and says he stooped to go into [facility] to have an EEG for further characterization.   Rewiew of Systems:   All systems negative except as marked bold or noted in the HPI;  Constitutional:    malaise, fever and chills. ;  Eyes:   eye pain, redness and discharge. ;  ENMT:   ear pain, hoarseness, nasal congestion, sinus pressure and sore throat. ;  Cardiovascular:    chest pain, palpitations, diaphoresis, dyspnea and peripheral edema.  Respiratory:   cough, hemoptysis, wheezing and stridor. ;  Gastrointestinal:  nausea, vomiting, diarrhea, constipation, abdominal pain, melena, blood in stool, hematemesis, jaundice and rectal bleeding. unusual weight loss..   Genitourinary:    frequency,  dysuria, incontinence,flank pain and hematuria; Musculoskeletal:   back pain from abnormal posture last 3 days and neck pain.  swelling and trauma.;  Skin: .  pruritus, rash, abrasions, bruising and skin lesion.; ulcerations Neuro:    headache, lightheadedness and neck stiffness.  weakness, altered level of consciousness, altered mental status, extremity weakness, burning feet, involuntary movement, seizure and syncope.  Psych:    anxiety, depression, insomnia, tearfulness, panic attacks, hallucinations, paranoia, suicidal or homicidal ideation    Past Medical History  Diagnosis Date  . Seizures   . Shoulder dislocation, recurrent   . Hypertension   . Headache(784.0)   . Cancer     brain tumor    Past Surgical History  Procedure Laterality Date  . Brain tumor excision      left fronto-temporal glioma, s/p resection at [facility] (Dr. Rennis Harding)  . Abdominal surgery      at age of [age]    Medications:  HOME MEDS: Prior to Admission medications   Medication Sig Start Date End Date Taking? Authorizing Provider  HYDROcodone-acetaminophen (NORCO/VICODIN) 5-325 MG per tablet Take 1 tablet by mouth once as needed for pain.   Yes Historical Provider, MD  levETIRAcetam (KEPPRA) 500 MG tablet Take 1,000 mg by mouth 2 (two) times daily.    Yes Historical Provider, MD  losartan-hydrochlorothiazide (HYZAAR) 100-12.5 MG per tablet Take 1 tablet by mouth daily.   Yes Historical Provider, MD  UNABLE TO FIND Take 3 tablets by mouth 2 (two) times daily. Med Name:  Garcinia Cambogia with Green Tea   Yes Historical Provider, MD     Allergies:  Allergies  Allergen Reactions  . Erythromycin Nausea And Vomiting    Social History:   reports that he has been smoking.  He does not have any smokeless tobacco history on file. He reports that  drinks alcohol. He reports that he does not use illicit drugs. 1ppd x 16 yrs  Family History: Family History  Problem Relation Age of Onset  . Diabetes    . Heart  Problems    . Anxiety disorder    . Asthma    . Cancer    . Hypertension    . Multiple sclerosis Father      Physical Exam: Filed Vitals:   12/01/12 1543 12/01/12 1715  BP: 132/97 130/83  Pulse: 135 109  Temp: 98.6 F (37 C) 99 F (37.2 C)  TempSrc: Oral Oral  Resp: 22 20  Height: 6' (1.829 m)   Weight: 90.719 kg (200 lb)   SpO2: 97% 96%   Blood pressure 130/83, pulse 109, temperature 99 F (37.2 C), temperature source Oral, resp. rate 20, height 6' (1.829 m), weight 90.719 kg (200 lb), SpO2 96.00%. Body mass index is 27.12 kg/(m^2).   GEN:  Pleasant young Caucasian gentleman lying bed complaining of pain; cooperative with exam PSYCH:  alert and oriented x4;  affect is appropriate. HEENT: Mucous membranes pink and anicteric; PERRLA; EOM intact; no cervical lymphadenopathy nor thyromegaly or carotid bruit; no JVD; Breasts:: Not examined CHEST WALL: No tenderness CHEST: Normal respiration, clear to auscultation bilaterally HEART: Tachycardic regular rhythm no murmurs rubs or gallops BACK: No kyphosis no scoliosis; no CVA tenderness ABDOMEN: Obese, soft non-tender; no masses, no organomegaly, normal abdominal bowel sounds; no pannus; no intertriginous candida. Rectal Exam: Not done EXTREMITIES:  Extensive erythema, swelling and tenderness of the right inner thigh. 6 cm abscess in the groin area of the upper thigh. Genitalia: not examined PULSES: 2+ and symmetric SKIN: Normal hydration no rash or ulceration other than above CNS: Cranial nerves 2-12 grossly intact no focal lateralizing neurologic deficit   Labs on Admission:  Basic Metabolic Panel:  Recent Labs Lab 12/01/12 1614  NA 133*  K 3.7  CL 96  CO2 24  GLUCOSE 117*  BUN 10  CREATININE 0.97  CALCIUM 9.8   Liver Function Tests:  Recent Labs Lab 12/01/12 1614  AST 31  ALT 34  ALKPHOS 118*  BILITOT 0.8  PROT 8.4*  ALBUMIN 3.8   No results found for this basename: LIPASE, AMYLASE,  in the last 168  hours No results found for this basename: AMMONIA,  in the last 168 hours CBC:  Recent Labs Lab 12/01/12 1614  WBC 15.1*  NEUTROABS 11.3*  HGB 16.2  HCT 46.8  MCV 83.0  PLT 249   Cardiac Enzymes:  Recent Labs Lab 12/01/12 1614  TROPONINI <0.30   BNP: No components found with this basename: POCBNP,  D-dimer: No components found with this basename: D-DIMER,  CBG: No results found for this basename: GLUCAP,  in the last 168 hours  Radiological Exams on Admission: Dg Chest Port 1 View  12/01/2012   *RADIOLOGY REPORT*  Clinical Data: Chest pain.  The patient has a history of brain tumor and hypertension.  PORTABLE CHEST - 1 VIEW  Comparison: September 12, 2012  Findings: There is elevation of right hemidiaphragm.  There is no focal infiltrate, pulmonary edema, or pleural effusion.  The mediastinal contour and cardiac silhouette are  normal.  The soft tissues and osseous structures are normal.  IMPRESSION: No focal pneumonia.  Elevation of right hemidiaphragm new since prior exam of September 12, 2012.   Original Report Authenticated By: Sherian Rein, M.D.       Assessment/Plan    Active Problems:   Seizures   Abscess of right thigh   Tobacco abuse   PLAN: Will give vancomycin and cefepime for cellulitis abscess pending results of cultures; He is scheduled for I&D of abscess in the morning Will ensure adequate pain control Nicotine cessation counseling and nicotine patch Continue Keppra Adequate IV fluid hydration   Other plans as per orders.  Code Status: Full code Family Communication: Plans discuss with patient and mother at bedside Likely home in a few days    Zekiah Coen Nocturnist [facility] Pager [phone]  12/01/2012, 7:41 PM

## 2012-12-02 ENCOUNTER — Encounter (HOSPITAL_COMMUNITY): Payer: Self-pay | Admitting: Anesthesiology

## 2012-12-02 ENCOUNTER — Encounter (HOSPITAL_COMMUNITY)
Admission: EM | Disposition: A | Discharge status: Home / Self Care | Payer: Self-pay | Source: Home / Self Care | Attending: Internal Medicine

## 2012-12-02 ENCOUNTER — Encounter (HOSPITAL_COMMUNITY): Payer: Self-pay | Admitting: *Deleted

## 2012-12-02 ENCOUNTER — Inpatient Hospital Stay (HOSPITAL_COMMUNITY): Payer: Medicaid Other | Admitting: Anesthesiology

## 2012-12-02 DIAGNOSIS — I10 Essential (primary) hypertension: Secondary | ICD-10-CM

## 2012-12-02 HISTORY — PX: INCISION AND DRAINAGE ABSCESS: SHX5864

## 2012-12-02 LAB — BASIC METABOLIC PANEL
BUN: 9 mg/dL (ref 6–23)
CO2: 26 mEq/L (ref 19–32)
Glucose, Bld: 100 mg/dL — ABNORMAL HIGH (ref 70–99)
Potassium: 3.8 mEq/L (ref 3.5–5.1)
Sodium: 134 mEq/L — ABNORMAL LOW (ref 135–145)

## 2012-12-02 LAB — CBC
HCT: 40.6 % (ref 39.0–52.0)
Hemoglobin: 13.6 g/dL (ref 13.0–17.0)
MCH: 28.2 pg (ref 26.0–34.0)
RBC: 4.83 MIL/uL (ref 4.22–5.81)

## 2012-12-02 LAB — VANCOMYCIN, TROUGH: Vancomycin Tr: 8.3 ug/mL — ABNORMAL LOW (ref 10.0–20.0)

## 2012-12-02 LAB — SURGICAL PCR SCREEN: Staphylococcus aureus: POSITIVE — AB

## 2012-12-02 SURGERY — INCISION AND DRAINAGE, ABSCESS
Anesthesia: General | Site: Groin | Laterality: Right | Wound class: Dirty or Infected

## 2012-12-02 MED ORDER — KETOROLAC TROMETHAMINE 30 MG/ML IJ SOLN
30.0000 mg | Freq: Once | INTRAMUSCULAR | Status: AC
  Administered 2012-12-02: 30 mg via INTRAVENOUS

## 2012-12-02 MED ORDER — CHLORHEXIDINE GLUCONATE CLOTH 2 % EX PADS
6.0000 | MEDICATED_PAD | Freq: Every day | CUTANEOUS | Status: DC
  Administered 2012-12-03: 6 via TOPICAL

## 2012-12-02 MED ORDER — SODIUM CHLORIDE 0.9 % IR SOLN
Status: DC | PRN
  Administered 2012-12-02: 1000 mL

## 2012-12-02 MED ORDER — LACTATED RINGERS IV SOLN
INTRAVENOUS | Status: DC
  Administered 2012-12-02: 1000 mL via INTRAVENOUS

## 2012-12-02 MED ORDER — VANCOMYCIN HCL IN DEXTROSE 1-5 GM/200ML-% IV SOLN
1000.0000 mg | Freq: Once | INTRAVENOUS | Status: AC
  Administered 2012-12-02: 1000 mg via INTRAVENOUS
  Filled 2012-12-02: qty 200

## 2012-12-02 MED ORDER — FENTANYL CITRATE 0.05 MG/ML IJ SOLN
25.0000 ug | INTRAMUSCULAR | Status: DC | PRN
  Administered 2012-12-02 (×2): 50 ug via INTRAVENOUS

## 2012-12-02 MED ORDER — ENOXAPARIN SODIUM 40 MG/0.4ML ~~LOC~~ SOLN
40.0000 mg | SUBCUTANEOUS | Status: DC
  Administered 2012-12-03: 40 mg via SUBCUTANEOUS
  Filled 2012-12-02: qty 0.4

## 2012-12-02 MED ORDER — KETOROLAC TROMETHAMINE 30 MG/ML IJ SOLN
INTRAMUSCULAR | Status: AC
  Filled 2012-12-02: qty 1

## 2012-12-02 MED ORDER — LIDOCAINE HCL (CARDIAC) 10 MG/ML IV SOLN
INTRAVENOUS | Status: DC | PRN
  Administered 2012-12-02: 20 mg via INTRAVENOUS

## 2012-12-02 MED ORDER — MIDAZOLAM HCL 2 MG/2ML IJ SOLN
1.0000 mg | INTRAMUSCULAR | Status: DC | PRN
  Administered 2012-12-02: 2 mg via INTRAVENOUS

## 2012-12-02 MED ORDER — PROPOFOL 10 MG/ML IV BOLUS
INTRAVENOUS | Status: DC | PRN
  Administered 2012-12-02: 180 mg via INTRAVENOUS
  Administered 2012-12-02: 20 mg via INTRAVENOUS

## 2012-12-02 MED ORDER — LIDOCAINE HCL (PF) 1 % IJ SOLN
INTRAMUSCULAR | Status: AC
  Filled 2012-12-02: qty 5

## 2012-12-02 MED ORDER — VANCOMYCIN HCL IN DEXTROSE 1-5 GM/200ML-% IV SOLN
INTRAVENOUS | Status: AC
  Filled 2012-12-02: qty 200

## 2012-12-02 MED ORDER — ONDANSETRON HCL 4 MG/2ML IJ SOLN: 4.0000 mg | Freq: Once | INTRAMUSCULAR | Status: DC | PRN

## 2012-12-02 MED ORDER — FENTANYL CITRATE 0.05 MG/ML IJ SOLN
INTRAMUSCULAR | Status: DC | PRN
  Administered 2012-12-02 (×3): 50 ug via INTRAVENOUS

## 2012-12-02 MED ORDER — FENTANYL CITRATE 0.05 MG/ML IJ SOLN
INTRAMUSCULAR | Status: AC
  Filled 2012-12-02: qty 2

## 2012-12-02 MED ORDER — VANCOMYCIN HCL 10 G IV SOLR
1250.0000 mg | Freq: Three times a day (TID) | INTRAVENOUS | Status: DC
  Administered 2012-12-03 (×2): 1250 mg via INTRAVENOUS
  Filled 2012-12-02 (×8): qty 1250

## 2012-12-02 MED ORDER — MIDAZOLAM HCL 2 MG/2ML IJ SOLN
INTRAMUSCULAR | Status: AC
  Filled 2012-12-02: qty 2

## 2012-12-02 MED ORDER — PROPOFOL 10 MG/ML IV EMUL
INTRAVENOUS | Status: AC
  Filled 2012-12-02: qty 20

## 2012-12-02 MED ORDER — MUPIROCIN 2 % EX OINT
1.0000 "application " | TOPICAL_OINTMENT | Freq: Two times a day (BID) | CUTANEOUS | Status: DC
  Administered 2012-12-02 – 2012-12-03 (×2): 1 via NASAL
  Filled 2012-12-02 (×2): qty 22

## 2012-12-02 MED ORDER — DEXTROSE 5 % IV SOLN
INTRAVENOUS | Status: AC
  Filled 2012-12-02: qty 2

## 2012-12-02 MED ORDER — FENTANYL CITRATE 0.05 MG/ML IJ SOLN
25.0000 ug | INTRAMUSCULAR | Status: AC
  Administered 2012-12-02 (×2): 25 ug via INTRAVENOUS

## 2012-12-02 SURGICAL SUPPLY — 25 items
BAG HAMPER (MISCELLANEOUS) ×2 IMPLANT
CLOTH BEACON ORANGE TIMEOUT ST (SAFETY) ×2 IMPLANT
COVER LIGHT HANDLE STERIS (MISCELLANEOUS) ×4 IMPLANT
ELECT REM PT RETURN 9FT ADLT (ELECTROSURGICAL) ×2
ELECTRODE REM PT RTRN 9FT ADLT (ELECTROSURGICAL) ×1 IMPLANT
GAUZE PACKING IODOFORM 2 (PACKING) ×1 IMPLANT
GLOVE BIOGEL M STRL SZ7.5 (GLOVE) ×2 IMPLANT
GLOVE BIOGEL PI IND STRL 7.0 (GLOVE) IMPLANT
GLOVE BIOGEL PI INDICATOR 7.0 (GLOVE) ×2
GLOVE ECLIPSE 6.5 STRL STRAW (GLOVE) ×1 IMPLANT
GLOVE EXAM NITRILE LRG STRL (GLOVE) ×1 IMPLANT
GOWN STRL REIN XL XLG (GOWN DISPOSABLE) ×5 IMPLANT
KIT ROOM TURNOVER APOR (KITS) ×2 IMPLANT
MANIFOLD NEPTUNE II (INSTRUMENTS) ×2 IMPLANT
NS IRRIG 1000ML POUR BTL (IV SOLUTION) ×2 IMPLANT
PACK MINOR (CUSTOM PROCEDURE TRAY) ×2 IMPLANT
PAD ABD 5X9 TENDERSORB (GAUZE/BANDAGES/DRESSINGS) ×1 IMPLANT
PAD ARMBOARD 7.5X6 YLW CONV (MISCELLANEOUS) ×2 IMPLANT
SET BASIN LINEN APH (SET/KITS/TRAYS/PACK) ×2 IMPLANT
SPONGE GAUZE 4X4 12PLY (GAUZE/BANDAGES/DRESSINGS) ×1 IMPLANT
SPONGE LAP 18X18 X RAY DECT (DISPOSABLE) ×1 IMPLANT
SWAB CULTURE LIQ STUART DBL (MISCELLANEOUS) ×1 IMPLANT
SYR BULB IRRIGATION 50ML (SYRINGE) ×2 IMPLANT
TAPE CLOTH SURG 4X10 WHT LF (GAUZE/BANDAGES/DRESSINGS) ×1 IMPLANT
TUBE ANAEROBIC PORT A CUL  W/M (MISCELLANEOUS) ×1 IMPLANT

## 2012-12-02 NOTE — Transfer of Care (Signed)
Immediate Anesthesia Transfer of Care Note  Patient: Benjamin Stephenson  Procedure(s) Performed: Procedure(s) (LRB): INCISION AND DRAINAGE ABSCESS RIGHT  GROIN (Right)  Patient Location: PACU  Anesthesia Type: General  Level of Consciousness: awake  Airway & Oxygen Therapy: Patient Spontanous Breathing and non-rebreather face mask  Post-op Assessment: Report given to PACU RN, Post -op Vital signs reviewed and stable and Patient moving all extremities  Post vital signs: Reviewed and stable  Complications: No apparent anesthesia complications

## 2012-12-02 NOTE — Plan of Care (Signed)
Problem: Phase I Progression Outcomes Goal: Other Phase I Outcomes/Goals Outcome: Completed/Met Date Met:  12/02/12 Incision and drainage of right thigh abscess was done today,dressing,dry and intact.

## 2012-12-02 NOTE — Progress Notes (Signed)
Contacts in containers at bedside.

## 2012-12-02 NOTE — Progress Notes (Signed)
Benjamin Stephenson XBM:[id number] DOB: [birth year] DOA: 12/01/2012 PCP: Leo Grosser, MD   Subjective: This man was admitted yesterday with a right groin abscess. He has undergone incision and drainage by Dr. Lovell Sheehan, surgery this morning. He is doing well.           Physical Exam: Blood pressure 126/87, pulse 97, temperature 97.7 F (36.5 C), temperature source Oral, resp. rate 20, height 6' (1.829 m), weight 87.998 kg (194 lb), SpO2 94.00%. He looks systemically well. Is not toxic or septic. Heart sounds are present and normal without murmurs. Lung fields are clear. The right groin area is covered with bandage and have not uncovered at this point. He is alert and orientated.   Investigations:  Recent Results (from the past 240 hour(s))  CULTURE, BLOOD (ROUTINE X 2)     Status: None   Collection Time    12/01/12  4:29 PM      Result Value Range Status   Specimen Description BLOOD RIGHT HAND   Final   Special Requests     Final   Value: BOTTLES DRAWN AEROBIC AND ANAEROBIC AEB=8CC ANA=5CC   Culture NO GROWTH 1 DAY   Final   Report Status PENDING   Incomplete  CULTURE, BLOOD (ROUTINE X 2)     Status: None   Collection Time    12/01/12  4:30 PM      Result Value Range Status   Specimen Description BLOOD LEFT HAND   Final   Special Requests BOTTLES DRAWN AEROBIC AND ANAEROBIC 5CC   Final   Culture NO GROWTH 1 DAY   Final   Report Status PENDING   Incomplete  SURGICAL PCR SCREEN     Status: Abnormal   Collection Time    12/02/12 12:05 AM      Result Value Range Status   MRSA, PCR NEGATIVE  NEGATIVE Final   Staphylococcus aureus POSITIVE (*) NEGATIVE Final   Comment:            The Xpert SA Assay (FDA     approved for NASAL specimens     in patients over 24 years of age),     is one component of     a comprehensive surveillance     program.  Test performance has     been validated by [employer] for patients greater     than or equal to 52 year old.     It  is not intended     to diagnose infection nor to     guide or monitor treatment.     RESULT CALLED TO, READ BACK BY AND VERIFIED WITH:     NAGLE E AT 0305 ON [id number] BY FORSYTH K     Basic Metabolic Panel:  Recent Labs  66/44/03 1614 12/02/12 0532  NA 133* 134*  K 3.7 3.8  CL 96 99  CO2 24 26  GLUCOSE 117* 100*  BUN 10 9  CREATININE 0.97 0.82  CALCIUM 9.8 8.9   Liver Function Tests:  Recent Labs  12/01/12 1614  AST 31  ALT 34  ALKPHOS 118*  BILITOT 0.8  PROT 8.4*  ALBUMIN 3.8     CBC:  Recent Labs  12/01/12 1614 12/02/12 0532  WBC 15.1* 12.5*  NEUTROABS 11.3*  --   HGB 16.2 13.6  HCT 46.8 40.6  MCV 83.0 84.1  PLT 249 240    Dg Chest Port 1 View  12/01/2012   *RADIOLOGY REPORT*  Clinical Data: Chest pain.  The patient has a history of brain tumor and hypertension.  PORTABLE CHEST - 1 VIEW  Comparison: September 12, 2012  Findings: There is elevation of right hemidiaphragm.  There is no focal infiltrate, pulmonary edema, or pleural effusion.  The mediastinal contour and cardiac silhouette are normal.  The soft tissues and osseous structures are normal.  IMPRESSION: No focal pneumonia.  Elevation of right hemidiaphragm new since prior exam of September 12, 2012.   Original Report Authenticated By: Sherian Rein, M.D.      Medications: I have reviewed the patient's current medications.  Impression: 1. Right groin abscess, status post incision and drainage. 2. History of seizure disorder, stable. 3. Tobacco abuse. 4. Hypertension, stable.     Plan: 1. Continue with intravenous antibiotics. 2. Probable discharge home tomorrow.  Consultants:  Surgery, Dr. Lovell Sheehan.   Procedures:  Incision and drainage this morning by Dr. Lovell Sheehan.   Antibiotics:  Intravenous Maxipime started 12/01/2012.  Intravenous vancomycin started 12/01/2012.                   Code Status: Full code.  Family Communication: Discussed plan with patient at the bedside.    Disposition Plan: Home when medically stable, probably tomorrow.  Time spent: 15 minutes.   LOS: 1 day   Wilson Singer Pager [phone]  12/02/2012, 11:19 AM

## 2012-12-02 NOTE — Anesthesia Postprocedure Evaluation (Signed)
Anesthesia Post Note  Patient: Benjamin Stephenson  Procedure(s) Performed: Procedure(s) (LRB): INCISION AND DRAINAGE ABSCESS RIGHT  GROIN (Right)  Anesthesia type: General  Patient location: 327  Post pain: Pain level controlled  Post assessment: Post-op Vital signs reviewed, Patient's Cardiovascular Status Stable, Respiratory Function Stable, Patent Airway, No signs of Nausea or vomiting and Pain level controlled  Last Vitals:  Filed Vitals:   12/02/12 1215  BP: 117/78  Pulse: 93  Temp: 36.7 C  Resp:     Post vital signs: Reviewed and stable  Level of consciousness: awake and alert   Complications: No apparent anesthesia complications

## 2012-12-02 NOTE — Anesthesia Preprocedure Evaluation (Signed)
Anesthesia Evaluation  Patient identified by MRN, date of birth, ID band Patient awake    Reviewed: Allergy & Precautions, H&P , NPO status , Patient's Chart, lab work & pertinent test results  Airway Mallampati: II TM Distance: >3 FB Neck ROM: Full    Dental  (+) Poor Dentition and Dental Advisory Given   Pulmonary  breath sounds clear to auscultation        Cardiovascular hypertension, Pt. on medications Rhythm:Regular Rate:Normal     Neuro/Psych  Headaches, Seizures -,     GI/Hepatic   Endo/Other    Renal/GU      Musculoskeletal   Abdominal   Peds  Hematology   Anesthesia Other Findings   Reproductive/Obstetrics                           Anesthesia Physical Anesthesia Plan  ASA: III  Anesthesia Plan: General   Post-op Pain Management:    Induction: Intravenous  Airway Management Planned: LMA  Additional Equipment:   Intra-op Plan:   Post-operative Plan: Extubation in OR  Informed Consent: I have reviewed the patients History and Physical, chart, labs and discussed the procedure including the risks, benefits and alternatives for the proposed anesthesia with the patient or authorized representative who has indicated his/her understanding and acceptance.     Plan Discussed with:   Anesthesia Plan Comments:         Anesthesia Quick Evaluation

## 2012-12-02 NOTE — Progress Notes (Signed)
UR Chart Review Completed  

## 2012-12-02 NOTE — Progress Notes (Signed)
ANTIBIOTIC CONSULT NOTE Pharmacy Consult for Vancomycin & Cefepime Indication: cellulitis  Allergies  Allergen Reactions  . Erythromycin Nausea And Vomiting    Patient Measurements: Height: 6' (182.9 cm) Weight: 194 lb (87.998 kg) IBW/kg (Calculated) : 77.6  Vital Signs: Temp: 98 F (36.7 C) (09/05 1215) Temp src: Oral (09/05 1215) BP: 117/78 mmHg (09/05 1215) Pulse Rate: 93 (09/05 1215) Intake/Output from previous day: 09/04 0701 - 09/05 0700 In: 1354 [IV Piggyback:1354] Out: -  Intake/Output from this shift: Total I/O In: 600 [I.V.:600] Out: 10 [Blood:10]  Labs:  Recent Labs  12/01/12 1614 12/02/12 0532  WBC 15.1* 12.5*  HGB 16.2 13.6  PLT 249 240  CREATININE 0.97 0.82   Estimated Creatinine Clearance: 139.3 ml/min (by C-G formula based on Cr of 0.82).  Recent Labs  12/02/12 1600  VANCOTROUGH 8.3*     Microbiology: Recent Results (from the past 720 hour(s))  CULTURE, BLOOD (ROUTINE X 2)     Status: None   Collection Time    12/01/12  4:29 PM      Result Value Range Status   Specimen Description BLOOD RIGHT HAND   Final   Special Requests     Final   Value: BOTTLES DRAWN AEROBIC AND ANAEROBIC AEB=8CC ANA=5CC   Culture NO GROWTH 1 DAY   Final   Report Status PENDING   Incomplete  CULTURE, BLOOD (ROUTINE X 2)     Status: None   Collection Time    12/01/12  4:30 PM      Result Value Range Status   Specimen Description BLOOD LEFT HAND   Final   Special Requests BOTTLES DRAWN AEROBIC AND ANAEROBIC 5CC   Final   Culture NO GROWTH 1 DAY   Final   Report Status PENDING   Incomplete  SURGICAL PCR SCREEN     Status: Abnormal   Collection Time    12/02/12 12:05 AM      Result Value Range Status   MRSA, PCR NEGATIVE  NEGATIVE Final   Staphylococcus aureus POSITIVE (*) NEGATIVE Final   Comment:            The Xpert SA Assay (FDA     approved for NASAL specimens     in patients over 84 years of age),     is one component of     a comprehensive  surveillance     program.  Test performance has     been validated by [employer] for patients greater     than or equal to 54 year old.     It is not intended     to diagnose infection nor to     guide or monitor treatment.     RESULT CALLED TO, READ BACK BY AND VERIFIED WITH:     NAGLE E AT 0305 ON [id number] BY FORSYTH K    Medical History: Past Medical History  Diagnosis Date  . Seizures   . Shoulder dislocation, recurrent   . Hypertension   . Headache(784.0)   . Cancer     brain tumor    Medications:  Scheduled:  . ceFEPime (MAXIPIME) IV  2 g Intravenous Q8H  . Chlorhexidine Gluconate Cloth  6 each Topical Daily  . [START ON 12/03/2012] enoxaparin (LOVENOX) injection  40 mg Subcutaneous Q24H  . losartan  100 mg Oral Daily   And  . hydrochlorothiazide  12.5 mg Oral Daily  . levETIRAcetam  1,000 mg Oral BID  . mupirocin  ointment  1 application Nasal BID  . nicotine  21 mg Transdermal Daily  . vancomycin  1,000 mg Intravenous Q8H   Assessment: 32 yo M admitted with rt groin abscess.  Planning for I&D tomorrow. WBC elevated.  Renal function at patient's baseline.  Vancomycin trough below goal  Goal of Therapy:  Vancomycin trough level 15-20 mcg/ml  Plan:  1) Increase Vancomycin to 1250 mg IV every 8 hours 2) Continue Cefepime 2gm IV every 8 hours 3) Check Vancomycin trough at steady state 4) Monitor renal function and cx data   Josephine Igo 12/02/2012,5:09 PM

## 2012-12-02 NOTE — Op Note (Signed)
Patient:  Benjamin Stephenson  DOB:  1981/01/19  MRN:  [id number]   Preop Diagnosis:  Abscess, right groin  Postop Diagnosis:  Same  Procedure:  Incision and drainage of abscess, right groin  Surgeon:  Franky Macho, M.D.  Anes:  General  Indications:  Patient is a 32 year old white male who presents with a right groin abscess. The risks and benefits of the procedure were fully explained to the patient, who gave informed consent.  Procedure note:  The patient was placed in the supine position. After general anesthesia was administered, the right groin was prepped and draped using usual sterile technique with Betadine. Surgical site confirmation was performed.  A fluctuant linear area was noted in the right groin region, just inferior to the groin crease. Incision was made internal fluid was noted in the subcutaneous tissue. The incision was extended medially along the linear indurated region. Aerobic and anaerobic cultures were taken and sent to microbiology. This did not appear to be involving the underlying muscle. It was contained to the subcutaneous area. The wound was irrigated normal saline. A bleeding was controlled using Bovie electrocautery. The wound is packed open with iodoform new gauze. A dressed a dressing was then applied.  All tape and needle counts were correct the end of the procedure. Patient was awakened and transferred to PACU in stable condition.  Complications:  None  EBL:  Minimal  Specimen:  Aerobic and anaerobic cultures of right groin abscess

## 2012-12-02 NOTE — Anesthesia Procedure Notes (Addendum)
Procedure Name: LMA Insertion Date/Time: 12/02/2012 9:25 AM Performed by: Franco Nones Pre-anesthesia Checklist: Patient identified, Patient being monitored, Emergency Drugs available, Timeout performed and Suction available Patient Re-evaluated:Patient Re-evaluated prior to inductionOxygen Delivery Method: Circle System Utilized Preoxygenation: Pre-oxygenation with 100% oxygen Intubation Type: IV induction Ventilation: Mask ventilation without difficulty LMA: LMA inserted LMA Size: 4.0 Number of attempts: 1 Placement Confirmation: positive ETCO2 and breath sounds checked- equal and bilateral Tube secured with: Tape

## 2012-12-02 NOTE — Progress Notes (Signed)
Pt positive for staph PCR and negative for MRSA PCR.

## 2012-12-03 MED ORDER — HYDROCODONE-ACETAMINOPHEN 5-325 MG PO TABS: 1.0000 | ORAL_TABLET | Freq: Once | ORAL | Status: DC | PRN

## 2012-12-03 MED ORDER — AMOXICILLIN-POT CLAVULANATE 875-125 MG PO TABS: 1.0000 | ORAL_TABLET | Freq: Two times a day (BID) | ORAL | Status: DC

## 2012-12-03 NOTE — Progress Notes (Signed)
Client is stable at this time. Discharged to home with family. Reviewed d/c instructions and medication schedule with client. Understanding voiced to instructions given. Client to follow up with surgeon, Dr. Lovell Sheehan on 9/914. Client is aware he will need to contact MD office to schedule appointment.

## 2012-12-03 NOTE — Progress Notes (Signed)
1 Day Post-Op  Subjective: Mild incisional pain.  Objective: Vital signs in last 24 hours: Temp:  [97.7 F (36.5 C)-98.8 F (37.1 C)] 98.8 F (37.1 C) (09/06 0416) Pulse Rate:  [93-110] 110 (09/06 0416) Resp:  [15-20] 20 (09/06 0416) BP: (117-151)/(78-96) 143/96 mmHg (09/06 0416) SpO2:  [93 %-98 %] 98 % (09/06 0416) Weight:  [94.575 kg (208 lb 8 oz)] 94.575 kg (208 lb 8 oz) (09/06 0416) Last BM Date: 12/01/12  Intake/Output from previous day: 09/05 0701 - 09/06 0700 In: 3947.5 [P.O.:240; I.V.:3407.5; IV Piggyback:300] Out: 2010 [Urine:2000; Blood:10] Intake/Output this shift: Total I/O In: 100 [P.O.:100] Out: 900 [Urine:900]  General appearance: alert, cooperative and no distress Skin: Right groin wound packing removed. Good granulation tissue noted at base. Mild induration still present surrounding the wound.  Lab Results:   Recent Labs  12/01/12 1614 12/02/12 0532  WBC 15.1* 12.5*  HGB 16.2 13.6  HCT 46.8 40.6  PLT 249 240   BMET  Recent Labs  12/01/12 1614 12/02/12 0532  NA 133* 134*  K 3.7 3.8  CL 96 99  CO2 24 26  GLUCOSE 117* 100*  BUN 10 9  CREATININE 0.97 0.82  CALCIUM 9.8 8.9   PT/INR No results found for this basename: LABPROT, INR,  in the last 72 hours  Studies/Results: Dg Chest Port 1 View  12/01/2012   *RADIOLOGY REPORT*  Clinical Data: Chest pain.  The patient has a history of brain tumor and hypertension.  PORTABLE CHEST - 1 VIEW  Comparison: September 12, 2012  Findings: There is elevation of right hemidiaphragm.  There is no focal infiltrate, pulmonary edema, or pleural effusion.  The mediastinal contour and cardiac silhouette are normal.  The soft tissues and osseous structures are normal.  IMPRESSION: No focal pneumonia.  Elevation of right hemidiaphragm new since prior exam of September 12, 2012.   Original Report Authenticated By: Sherian Rein, M.D.    Anti-infectives: Anti-infectives   Start     Dose/Rate Route Frequency Ordered Stop   12/03/12 0000  vancomycin (VANCOCIN) 1,250 mg in sodium chloride 0.9 % 250 mL IVPB     1,250 mg 166.7 mL/hr over 90 Minutes Intravenous Every 8 hours 12/02/12 1714     12/02/12 1730  vancomycin (VANCOCIN) IVPB 1000 mg/200 mL premix     1,000 mg 200 mL/hr over 60 Minutes Intravenous  Once 12/02/12 1725 12/02/12 1830   12/02/12 0600  ceFEPIme (MAXIPIME) 2 g in dextrose 5 % 50 mL IVPB     2 g 100 mL/hr over 30 Minutes Intravenous 3 times per day 12/01/12 2224     12/01/12 2359  vancomycin (VANCOCIN) IVPB 1000 mg/200 mL premix  Status:  Discontinued     1,000 mg 200 mL/hr over 60 Minutes Intravenous Every 8 hours 12/01/12 2223 12/02/12 1725   12/01/12 2100  ceFEPIme (MAXIPIME) 2 g in dextrose 5 % 50 mL IVPB     2 g 100 mL/hr over 30 Minutes Intravenous  Once 12/01/12 2056 12/01/12 2302   12/01/12 1615  vancomycin (VANCOCIN) IVPB 1000 mg/200 mL premix     1,000 mg 200 mL/hr over 60 Minutes Intravenous  Once 12/01/12 1606 12/01/12 1738      Assessment/Plan: s/p Procedure(s): INCISION AND DRAINAGE ABSCESS RIGHT  GROIN Impression: Stable status post incision and drainage of right groin abscess. Plan: Patient may be discharged today. He was instructed on wound care. Augmentin will be prescribed by Dr. Ronnald Ramp. I will see him in followup on 12/06/2012.  LOS: 2 days    Aquinnah Devin A 12/03/2012

## 2012-12-03 NOTE — Discharge Summary (Signed)
Physician Discharge Summary  Benjamin Stephenson NWG:[id number] DOB: 09/16/1980 DOA: 12/01/2012  PCP: Leo Grosser, MD  Admit date: 12/01/2012 Discharge date: 12/03/2012  Time spent: Greater than 30 minutes  Recommendations for Outpatient Follow-up:  1. Follow with surgery, Dr. Lovell Sheehan early next week.   Discharge Diagnoses:  1. Abscess of the right thigh, status post incision and drainage. 2. Hypertension, controlled. 3. Seizure disorder, stable. 4. Tobacco abuse   Discharge Condition: Stable and improved.  Diet recommendation: Regular.  Filed Weights   12/02/12 0609 12/02/12 0824 12/03/12 0416  Weight: 194.7 kg (429 lb 3.8 oz) 87.998 kg (194 lb) 94.575 kg (208 lb 8 oz)    History of present illness:  This 32 year old man presents to the hospital with symptoms of redness in the right thigh for 2 days. Please see initial history as outlined below: HPI:  Benjamin Stephenson is a 32 y.o. male. Young Caucasian gentleman with a history of seizure disorder noted a painful lump in his right upper inner thigh about 2 days ago, self medicated placing raw onions on it, and the area got progressively more swollen red and tender currently presented to the emergency room today with extensive inflammation of the right inner thigh. He has had a surgical consult for planned drainage of abscess and the hospitalist service was called for admission.  his real reason for coming to the emergency room is actually sudden onset of chest tightness and shortness of breath, but that resolved rapidly adjust the tension in the emergency room, without any specific treatment directed at his respiration. He has no swelling below the knee; he has had no chest pain. He does not have a history of bronchitis but he does smoke.  He reports he suffered a seizure disorder for about 6 years for which she takes Keppra; he reports about 10-16 seizures per day; he refers to them as mini seizures and says he stooped to go into  [facility] to have an EEG for further characterization.  Hospital Course:  Patient was admitted and started on intravenous antibiotics. He was seen by surgery, Dr. Lovell Sheehan who recommended local treatment with incision and drainage which he performed yesterday, 12/02/2012. Postoperatively he has done well. Is not febrile. He was reviewed by Dr. Lovell Sheehan this morning and felt that he can be discharged home with oral antibiotics for another week. He'll be reviewed in Dr. Lovell Sheehan office early next week or  Procedures:  None.  Consultations:  Surgery, Dr. Lovell Sheehan.  Discharge Exam: Filed Vitals:   12/03/12 0416  BP: 143/96  Pulse: 110  Temp: 98.8 F (37.1 C)  Resp: 20    General: He looks systemically well. Is not toxic or septic. Cardiovascular: Heart sounds are present without murmurs or added sounds. Respiratory: Lung fields are clear. Abdomen is soft and nontender. The area of incision and drainage in the right medial thigh is packed, looks inflamed.  Discharge Instructions  Discharge Orders   Future Orders Complete By Expires   Diet - low sodium heart healthy  As directed    Increase activity slowly  As directed        Medication List         amoxicillin-clavulanate 875-125 MG per tablet  Commonly known as:  AUGMENTIN  Take 1 tablet by mouth 2 (two) times daily.     HYDROcodone-acetaminophen 5-325 MG per tablet  Commonly known as:  NORCO/VICODIN  Take 1 tablet by mouth once as needed for pain.     HYZAAR 100-12.5  MG per tablet  Generic drug:  losartan-hydrochlorothiazide  Take 1 tablet by mouth daily.     levETIRAcetam 500 MG tablet  Commonly known as:  KEPPRA  Take 1,000 mg by mouth 2 (two) times daily.     UNABLE TO FIND  Take 3 tablets by mouth 2 (two) times daily. Med Name: Garcinia Cambogia with Green Tea       Allergies  Allergen Reactions  . Erythromycin Nausea And Vomiting       Follow-up Information   Follow up with Dalia Heading, MD.  Schedule an appointment as soon as possible for a visit on 12/06/2012.   Specialty:  General Surgery   Contact information:   [address] [place] [address] [phone]        The results of significant diagnostics from this hospitalization (including imaging, microbiology, ancillary and laboratory) are listed below for reference.    Significant Diagnostic Studies: Dg Chest Port 1 View  12/01/2012   *RADIOLOGY REPORT*  Clinical Data: Chest pain.  The patient has a history of brain tumor and hypertension.  PORTABLE CHEST - 1 VIEW  Comparison: September 12, 2012  Findings: There is elevation of right hemidiaphragm.  There is no focal infiltrate, pulmonary edema, or pleural effusion.  The mediastinal contour and cardiac silhouette are normal.  The soft tissues and osseous structures are normal.  IMPRESSION: No focal pneumonia.  Elevation of right hemidiaphragm new since prior exam of September 12, 2012.   Original Report Authenticated By: Sherian Rein, M.D.    Microbiology: Recent Results (from the past 240 hour(s))  CULTURE, BLOOD (ROUTINE X 2)     Status: None   Collection Time    12/01/12  4:29 PM      Result Value Range Status   Specimen Description BLOOD RIGHT HAND   Final   Special Requests     Final   Value: BOTTLES DRAWN AEROBIC AND ANAEROBIC AEB=8CC ANA=5CC   Culture NO GROWTH 1 DAY   Final   Report Status PENDING   Incomplete  CULTURE, BLOOD (ROUTINE X 2)     Status: None   Collection Time    12/01/12  4:30 PM      Result Value Range Status   Specimen Description BLOOD LEFT HAND   Final   Special Requests BOTTLES DRAWN AEROBIC AND ANAEROBIC 5CC   Final   Culture NO GROWTH 1 DAY   Final   Report Status PENDING   Incomplete  SURGICAL PCR SCREEN     Status: Abnormal   Collection Time    12/02/12 12:05 AM      Result Value Range Status   MRSA, PCR NEGATIVE  NEGATIVE Final   Staphylococcus aureus POSITIVE (*) NEGATIVE Final   Comment:            The Xpert SA Assay (FDA      approved for NASAL specimens     in patients over 30 years of age),     is one component of     a comprehensive surveillance     program.  Test performance has     been validated by [employer] for patients greater     than or equal to 25 year old.     It is not intended     to diagnose infection nor to     guide or monitor treatment.     RESULT CALLED TO, READ BACK BY AND VERIFIED WITH:  NAGLE E AT 0305 ON [id number] BY FORSYTH K  CULTURE, ROUTINE-ABSCESS     Status: None   Collection Time    12/02/12  9:39 AM      Result Value Range Status   Specimen Description GROIN RIGHT   Final   Special Requests VANCOMYCIN MAXIPIME   Final   Gram Stain PENDING   Incomplete   Culture     Final   Value: NO GROWTH 1 DAY     Performed at [facility]   Report Status PENDING   Incomplete     Labs: Basic Metabolic Panel:  Recent Labs Lab 12/01/12 1614 12/02/12 0532  NA 133* 134*  K 3.7 3.8  CL 96 99  CO2 24 26  GLUCOSE 117* 100*  BUN 10 9  CREATININE 0.97 0.82  CALCIUM 9.8 8.9   Liver Function Tests:  Recent Labs Lab 12/01/12 1614  AST 31  ALT 34  ALKPHOS 118*  BILITOT 0.8  PROT 8.4*  ALBUMIN 3.8     CBC:  Recent Labs Lab 12/01/12 1614 12/02/12 0532  WBC 15.1* 12.5*  NEUTROABS 11.3*  --   HGB 16.2 13.6  HCT 46.8 40.6  MCV 83.0 84.1  PLT 249 240   Cardiac Enzymes:  Recent Labs Lab 12/01/12 1614  TROPONINI <0.30        Signed:  Loisann Roach C  [facility] 12/03/2012, 10:30 AM

## 2012-12-05 ENCOUNTER — Encounter (HOSPITAL_COMMUNITY): Payer: Self-pay | Admitting: General Surgery

## 2012-12-05 LAB — CULTURE, ROUTINE-ABSCESS

## 2012-12-06 LAB — CULTURE, BLOOD (ROUTINE X 2)

## 2012-12-07 LAB — ANAEROBIC CULTURE

## 2013-01-11 ENCOUNTER — Encounter: Payer: Self-pay | Admitting: Family Medicine

## 2013-01-11 ENCOUNTER — Ambulatory Visit (INDEPENDENT_AMBULATORY_CARE_PROVIDER_SITE_OTHER): Payer: Medicaid Other | Admitting: Family Medicine

## 2013-01-11 VITALS — BP 120/90 | HR 100 | Temp 98.8°F | Resp 20 | Wt 197.0 lb

## 2013-01-11 DIAGNOSIS — J069 Acute upper respiratory infection, unspecified: Secondary | ICD-10-CM

## 2013-01-11 DIAGNOSIS — A630 Anogenital (venereal) warts: Secondary | ICD-10-CM

## 2013-01-11 DIAGNOSIS — R509 Fever, unspecified: Secondary | ICD-10-CM

## 2013-01-11 DIAGNOSIS — R6883 Chills (without fever): Secondary | ICD-10-CM

## 2013-01-11 LAB — INFLUENZA A AND B: Inflenza A Ag: NEGATIVE

## 2013-01-11 MED ORDER — PROMETHAZINE-CODEINE 6.25-10 MG/5ML PO SYRP: 5.0000 mL | ORAL_SOLUTION | ORAL | Status: DC | PRN

## 2013-01-11 MED ORDER — IMIQUIMOD 5 % EX CREA
TOPICAL_CREAM | CUTANEOUS | Status: DC
Start: 2013-01-11 — End: 2013-03-06

## 2013-01-11 NOTE — Assessment & Plan Note (Addendum)
Genital warts per history. I will send in the Aldara cream for him to use at home for treatment I  Checked chart, no recent script given

## 2013-01-11 NOTE — Progress Notes (Signed)
  Subjective:    Patient ID: Benjamin Stephenson, male    DOB: 06/06/1980, 32 y.o.   MRN: [id number]  HPI  Patient here with cough congestion body aches for the past 48 hours. He's been using Mucinex. He admits to subjective fever and chills. The cough has mild production. He denies any wheezing. No sick contacts.  He also asked for a refill on his cream that he was using for genital warts. He has an outbreak currently on his shaft.    Review of Systems  GEN-+ fatigue, +fever, weight loss,weakness, recent illness HEENT- denies eye drainage, change in vision, nasal discharge, CVS- denies chest pain, palpitations RESP- denies SOB, +cough, wheeze ABD- denies N/V, change in stools, abd pain GU- denies dysuria, hematuria, dribbling, incontinence MSK- denies joint pain, +muscle aches, injury Neuro- denies headache, dizziness, syncope, seizure activity      Objective:   Physical Exam GEN- NAD, alert and oriented x3 HEENT- PERRL, EOMI, non injected sclera, pink conjunctiva, MMM, oropharynx mild injection, TM clear bilat no effusion, no maxillary sinus tenderness,nares clear rhinorrhea  Neck- Supple, no LAD CVS- RRR, no murmur RESP-CTAB EXT- No edema Pulses- Radial 2+         Assessment & Plan:

## 2013-01-11 NOTE — Assessment & Plan Note (Signed)
Supportive care. His influenza is negative. Cough medication was sent

## 2013-01-11 NOTE — Patient Instructions (Signed)
Start cough medication Other meds called in COntinue mucinex Viral Infections A virus is a type of germ. Viruses can cause:  Minor sore throats.  Aches and pains.  Headaches.  Runny nose.  Rashes.  Watery eyes.  Tiredness.  Coughs.  Loss of appetite.  Feeling sick to your stomach (nausea).  Throwing up (vomiting).  Watery poop (diarrhea). HOME CARE   Only take medicines as told by your doctor.  Drink enough water and fluids to keep your pee (urine) clear or pale yellow. Sports drinks are a good choice.  Get plenty of rest and eat healthy. Soups and broths with crackers or rice are fine. GET HELP RIGHT AWAY IF:   You have a very bad headache.  You have shortness of breath.  You have chest pain or neck pain.  You have an unusual rash.  You cannot stop throwing up.  You have watery poop that does not stop.  You cannot keep fluids down.  You or your child has a temperature by mouth above 102 F (38.9 C), not controlled by medicine.  Your baby is older than 3 months with a rectal temperature of 102 F (38.9 C) or higher.  Your baby is 64 months old or younger with a rectal temperature of 100.4 F (38 C) or higher. MAKE SURE YOU:   Understand these instructions.  Will watch this condition.  Will get help right away if you are not doing well or get worse. Document Released: 02/27/2008 Document Revised: 06/08/2011 Document Reviewed: 07/22/2010 [facility] Patient Information 2014 [place], [place].

## 2013-02-21 ENCOUNTER — Other Ambulatory Visit: Payer: Self-pay | Admitting: *Deleted

## 2013-02-21 ENCOUNTER — Telehealth: Payer: Self-pay | Admitting: Family Medicine

## 2013-02-21 MED ORDER — LOSARTAN POTASSIUM-HCTZ 100-12.5 MG PO TABS: 1.0000 | ORAL_TABLET | Freq: Every day | ORAL | Status: DC

## 2013-02-21 NOTE — Telephone Encounter (Signed)
error 

## 2013-02-21 NOTE — Telephone Encounter (Signed)
Meds refilled.

## 2013-02-21 NOTE — Telephone Encounter (Signed)
Insurance will not cover losartan/hctz can we change this?  He has MCD and they have to try Lisinopril 1st.

## 2013-02-24 ENCOUNTER — Telehealth: Payer: Self-pay | Admitting: Family Medicine

## 2013-02-24 MED ORDER — LISINOPRIL 40 MG PO TABS: 40.0000 mg | ORAL_TABLET | Freq: Every day | ORAL | Status: DC

## 2013-02-24 MED ORDER — HYDROCHLOROTHIAZIDE 12.5 MG PO CAPS: 12.5000 mg | ORAL_CAPSULE | Freq: Every day | ORAL | Status: DC

## 2013-02-24 NOTE — Telephone Encounter (Signed)
Losartan not approved.  Lisinopril and HCTZ sent to pharmacy for one month and follow up visits scheduled

## 2013-02-24 NOTE — Telephone Encounter (Signed)
Switch to lisinopril hct 40/12.5 poqday.  Also NTBS.  I don't think I have seen him in 1 year.

## 2013-02-24 NOTE — Telephone Encounter (Signed)
LMTRC

## 2013-03-06 ENCOUNTER — Ambulatory Visit (INDEPENDENT_AMBULATORY_CARE_PROVIDER_SITE_OTHER): Payer: Medicaid Other | Admitting: Family Medicine

## 2013-03-06 ENCOUNTER — Encounter: Payer: Self-pay | Admitting: Family Medicine

## 2013-03-06 VITALS — BP 130/90 | HR 84 | Temp 97.6°F | Resp 16 | Ht 72.0 in | Wt 200.0 lb

## 2013-03-06 DIAGNOSIS — I10 Essential (primary) hypertension: Secondary | ICD-10-CM

## 2013-03-06 DIAGNOSIS — F32A Depression, unspecified: Secondary | ICD-10-CM

## 2013-03-06 DIAGNOSIS — Z716 Tobacco abuse counseling: Secondary | ICD-10-CM

## 2013-03-06 DIAGNOSIS — Z7189 Other specified counseling: Secondary | ICD-10-CM

## 2013-03-06 DIAGNOSIS — F329 Major depressive disorder, single episode, unspecified: Secondary | ICD-10-CM

## 2013-03-06 MED ORDER — ESCITALOPRAM OXALATE 10 MG PO TABS: 10.0000 mg | ORAL_TABLET | Freq: Every day | ORAL | Status: DC

## 2013-03-06 MED ORDER — NICOTINE POLACRILEX 4 MG MT GUM: 4.0000 mg | CHEWING_GUM | OROMUCOSAL | Status: DC | PRN

## 2013-03-06 NOTE — Progress Notes (Signed)
   Subjective:    Patient ID: Benjamin Stephenson, male    DOB: 1980-04-12, 32 y.o.   MRN: [id number]  HPI Patient has a history of hypertension. He is currently on lisinopril 40 mg by mouth daily and hydrochlorothiazide 12.5 mg daily. He denies any chest pain short of breath or dyspnea on exertion. He continues to smoke.  In September he had a BMP checked at the hospital is normal. He is now were drawn this morning due to fear of needles. He has not had his cholesterol checked. He does have one major concern. He reports 6 months of worsening depression. His general apathy and anhedonia. He denies any suicidal ideation although he does report daily anxiety. He has poor energy, poor concentration. Past Medical History  Diagnosis Date  . Seizures   . Shoulder dislocation, recurrent   . Hypertension   . Headache(784.0)   . Cancer     brain tumor   Current Outpatient Prescriptions on File Prior to Visit  Medication Sig Dispense Refill  . hydrochlorothiazide (MICROZIDE) 12.5 MG capsule Take 1 capsule (12.5 mg total) by mouth daily.  30 capsule  0  . lisinopril (PRINIVIL,ZESTRIL) 40 MG tablet Take 1 tablet (40 mg total) by mouth daily.  30 tablet  0   No current facility-administered medications on file prior to visit.   Allergies  Allergen Reactions  . Erythromycin Nausea And Vomiting   Past Surgical History  Procedure Laterality Date  . Brain tumor excision      left fronto-temporal glioma, s/p resection at [facility] (Dr. Rennis Harding)  . Abdominal surgery      at age of [age]  . Incision and drainage abscess Right 12/02/2012    Procedure: INCISION AND DRAINAGE ABSCESS RIGHT  GROIN;  Surgeon: Dalia Heading, MD;  Location: [facility];  Service: General;  Laterality: Right;      Review of Systems  All other systems reviewed and are negative.       Objective:   Physical Exam  Vitals reviewed. Constitutional: He is oriented to person, place, and time.  Neck: Neck supple. No thyromegaly present.    Cardiovascular: Normal rate, regular rhythm, normal heart sounds and intact distal pulses.   No murmur heard. Pulmonary/Chest: Effort normal and breath sounds normal. No respiratory distress. He has no wheezes. He has no rales.  Abdominal: Soft. Bowel sounds are normal. He exhibits no distension. There is no tenderness. There is no rebound.  Neurological: He is alert and oriented to person, place, and time. He has normal reflexes. He displays normal reflexes. No cranial nerve deficit. He exhibits normal muscle tone. Coordination normal.          Assessment & Plan:  1. Depression Begin Lexapro 10 mg by mouth daily and recheck in one month. - escitalopram (LEXAPRO) 10 MG tablet; Take 1 tablet (10 mg total) by mouth daily.  Dispense: 30 tablet; Refill: 2  2. HTN (hypertension) Blood pressures adequately controlled. Return fasting for a fasting lipid panel. - Lipid panel; Future  3. Encounter for smoking cessation counseling Recommended smoking cessation. The patient is willing to try the Nicorette gum. Therefore sent a prescription of Nicorette gum to his pharmacy. Also recommended flu shot but patient declined due to his fear of needles

## 2013-04-06 ENCOUNTER — Telehealth: Payer: Self-pay | Admitting: Family Medicine

## 2013-04-06 NOTE — Telephone Encounter (Signed)
Pt mother is calling because pt is out of his BP medication  Pharmacy [facility] in [place] Call back [phone]

## 2013-04-07 MED ORDER — LISINOPRIL 40 MG PO TABS: 40.0000 mg | ORAL_TABLET | Freq: Every day | ORAL | Status: DC

## 2013-04-07 MED ORDER — HYDROCHLOROTHIAZIDE 12.5 MG PO CAPS: 12.5000 mg | ORAL_CAPSULE | Freq: Every day | ORAL | Status: DC

## 2013-04-07 NOTE — Telephone Encounter (Signed)
Rx Refilled  

## 2013-07-07 ENCOUNTER — Telehealth: Payer: Self-pay | Admitting: *Deleted

## 2013-07-07 ENCOUNTER — Other Ambulatory Visit: Payer: Self-pay | Admitting: Family Medicine

## 2013-07-07 MED ORDER — IMIQUIMOD 5 % EX CREA: TOPICAL_CREAM | CUTANEOUS | Status: DC

## 2013-07-07 NOTE — Telephone Encounter (Signed)
Received fax from pharmacy.  Reports that Imiquimod cream is not covered by insurance.   Call placed to pharmacy who reports that name brand Aldara is covered.   MD made aware and VO given for Aldara with same directions.

## 2013-12-11 ENCOUNTER — Encounter (HOSPITAL_COMMUNITY): Payer: Self-pay | Admitting: Emergency Medicine

## 2013-12-11 ENCOUNTER — Emergency Department (HOSPITAL_COMMUNITY)
Admission: EM | Admit: 2013-12-11 | Discharge: 2013-12-11 | Disposition: A | Discharge status: Home / Self Care | Payer: Medicare Other | Attending: Emergency Medicine | Admitting: Emergency Medicine

## 2013-12-11 DIAGNOSIS — K029 Dental caries, unspecified: Secondary | ICD-10-CM | POA: Diagnosis not present

## 2013-12-11 DIAGNOSIS — Z86011 Personal history of benign neoplasm of the brain: Secondary | ICD-10-CM | POA: Diagnosis not present

## 2013-12-11 DIAGNOSIS — I10 Essential (primary) hypertension: Secondary | ICD-10-CM | POA: Diagnosis not present

## 2013-12-11 DIAGNOSIS — K044 Acute apical periodontitis of pulpal origin: Secondary | ICD-10-CM | POA: Insufficient documentation

## 2013-12-11 DIAGNOSIS — K089 Disorder of teeth and supporting structures, unspecified: Secondary | ICD-10-CM | POA: Diagnosis present

## 2013-12-11 DIAGNOSIS — F17200 Nicotine dependence, unspecified, uncomplicated: Secondary | ICD-10-CM | POA: Diagnosis not present

## 2013-12-11 DIAGNOSIS — Z79899 Other long term (current) drug therapy: Secondary | ICD-10-CM | POA: Diagnosis not present

## 2013-12-11 DIAGNOSIS — K047 Periapical abscess without sinus: Secondary | ICD-10-CM

## 2013-12-11 DIAGNOSIS — Z8781 Personal history of (healed) traumatic fracture: Secondary | ICD-10-CM | POA: Diagnosis not present

## 2013-12-11 MED ORDER — AMOXICILLIN 500 MG PO CAPS
500.0000 mg | ORAL_CAPSULE | Freq: Three times a day (TID) | ORAL | Status: DC
Start: 2013-12-11 — End: 2014-01-30

## 2013-12-11 MED ORDER — HYDROCODONE-ACETAMINOPHEN 5-325 MG PO TABS: 1.0000 | ORAL_TABLET | ORAL | Status: DC | PRN

## 2013-12-11 NOTE — Discharge Instructions (Signed)
Take antibiotic to completion. Take Vicodin for severe pain only. No driving or operating heavy machinery while taking vicodin. This medication may cause drowsiness.  Dental Pain A tooth ache may be caused by cavities (tooth decay). Cavities expose the nerve of the tooth to air and hot or cold temperatures. It may come from an infection or abscess (also called a boil or furuncle) around your tooth. It is also often caused by dental caries (tooth decay). This causes the pain you are having. DIAGNOSIS  Your caregiver can diagnose this problem by exam. TREATMENT   If caused by an infection, it may be treated with medications which kill germs (antibiotics) and pain medications as prescribed by your caregiver. Take medications as directed.  Only take over-the-counter or prescription medicines for pain, discomfort, or fever as directed by your caregiver.  Whether the tooth ache today is caused by infection or dental disease, you should see your dentist as soon as possible for further care. SEEK MEDICAL CARE IF: The exam and treatment you received today has been provided on an emergency basis only. This is not a substitute for complete medical or dental care. If your problem worsens or new problems (symptoms) appear, and you are unable to meet with your dentist, call or return to this location. SEEK IMMEDIATE MEDICAL CARE IF:   You have a fever.  You develop redness and swelling of your face, jaw, or neck.  You are unable to open your mouth.  You have severe pain uncontrolled by pain medicine. MAKE SURE YOU:   Understand these instructions.  Will watch your condition.  Will get help right away if you are not doing well or get worse. Document Released: 03/16/2005 Document Revised: 06/08/2011 Document Reviewed: 11/02/2007 [facility] Patient Information 2015 [place], [place]. This information is not intended to replace advice given to you by your health care provider. Make sure you discuss any  questions you have with your health care provider.  Dental Caries Dental caries (also called tooth decay) is the most common oral disease. It can occur at any age but is more common in children and young adults.  HOW DENTAL CARIES DEVELOPS  The process of decay begins when bacteria and foods (particularly sugars and starches) combine in your mouth to produce plaque. Plaque is a substance that sticks to the hard, outer surface of a tooth (enamel). The bacteria in plaque produce acids that attack enamel. These acids may also attack the root surface of a tooth (cementum) if it is exposed. Repeated attacks dissolve these surfaces and create holes in the tooth (cavities). If left untreated, the acids destroy the other layers of the tooth.  RISK FACTORS  Frequent sipping of sugary beverages.   Frequent snacking on sugary and starchy foods, especially those that easily get stuck in the teeth.   Poor oral hygiene.   Dry mouth.   Substance abuse such as methamphetamine abuse.   Broken or poor-fitting dental restorations.   Eating disorders.   Gastroesophageal reflux disease (GERD).   Certain radiation treatments to the head and neck. SYMPTOMS In the early stages of dental caries, symptoms are seldom present. Sometimes white, chalky areas may be seen on the enamel or other tooth layers. In later stages, symptoms may include:  Pits and holes on the enamel.  Toothache after sweet, hot, or cold foods or drinks are consumed.  Pain around the tooth.  Swelling around the tooth. DIAGNOSIS  Most of the time, dental caries is detected during a regular dental  checkup. A diagnosis is made after a thorough medical and dental history is taken and the surfaces of your teeth are checked for signs of dental caries. Sometimes special instruments, such as lasers, are used to check for dental caries. Dental X-ray exams may be taken so that areas not visible to the eye (such as between the contact  areas of the teeth) can be checked for cavities.  TREATMENT  If dental caries is in its early stages, it may be reversed with a fluoride treatment or an application of a remineralizing agent at the dental office. Thorough brushing and flossing at home is needed to aid these treatments. If it is in its later stages, treatment depends on the location and extent of tooth destruction:   If a small area of the tooth has been destroyed, the destroyed area will be removed and cavities will be filled with a material such as gold, silver amalgam, or composite resin.   If a large area of the tooth has been destroyed, the destroyed area will be removed and a cap (crown) will be fitted over the remaining tooth structure.   If the center part of the tooth (pulp) is affected, a procedure called a root canal will be needed before a filling or crown can be placed.   If most of the tooth has been destroyed, the tooth may need to be pulled (extracted). HOME CARE INSTRUCTIONS You can prevent, stop, or reverse dental caries at home by practicing good oral hygiene. Good oral hygiene includes:  Thoroughly cleaning your teeth at least twice a day with a toothbrush and dental floss.   Using a fluoride toothpaste. A fluoride mouth rinse may also be used if recommended by your dentist or health care provider.   Restricting the amount of sugary and starchy foods and sugary liquids you consume.   Avoiding frequent snacking on these foods and sipping of these liquids.   Keeping regular visits with a dentist for checkups and cleanings. PREVENTION   Practice good oral hygiene.  Consider a dental sealant. A dental sealant is a coating material that is applied by your dentist to the pits and grooves of teeth. The sealant prevents food from being trapped in them. It may protect the teeth for several years.  Ask about fluoride supplements if you live in a community without fluorinated water or with water that has a  low fluoride content. Use fluoride supplements as directed by your dentist or health care provider.  Allow fluoride varnish applications to teeth if directed by your dentist or health care provider. Document Released: 12/06/2001 Document Revised: 07/31/2013 Document Reviewed: 03/18/2012 [facility] Patient Information 2015 [place], [place]. This information is not intended to replace advice given to you by your health care provider. Make sure you discuss any questions you have with your health care provider.  Diet and Dental Disease What you eat affects the health of your teeth. Diet plays an important role in developing healthy teeth and preventing dental disease, such as:  Tooth decay.  Gum (periodontal) disease.  Developmental defects of the enamel. This is when visible surfaces of the tooth do not form properly, leaving the tooth more prone to decay.  Dental erosion. This is when the teeth wear away. Knowing which foods promote strong teeth and which foods to stay away from can help you prevent poor oral health. If your diet lacks proper nutrients, it may be difficult for the tissues in your mouth to prevent dental disease. FOODS THAT PROMOTE DENTAL  DISEASE The following foods either contain acids or create acid in your mouth that increases the risk of tooth decay:  Sugary foods, such as candy and baked goods (cookies, cake).  Soft drinks (carbonated and non-carbonated) such as soda, sports drinks, and fruit juice.  Citrus fruits, such as oranges and lemons.  Berries.  Honey.  Herbal teas that contain berries and other fruits.  Wines and other alcoholic beverages.  Vinegar or vinegar containing foods, such as pickles.  Starchy snacks such as crackers, potato chips, [place] fries, and pasta. Some of these foods have health benefits. Eat these foods in moderation. The more often you eat these foods, the more frequently you are exposing your teeth to the acid that causes dental  diseases. FOODS THAT REDUCE THE RISK OF DENTAL DISEASE Certain foods help to keep the teeth strong and reduce the risk of tooth decay. These foods include:  Dairy products, such as cow's milk and cheese. Eating dairy with a meal or sugary snack reduces the risk of tooth decay.  Gums and foods that substitute sugar with sorbitol, mannitol, and xylitol.  Fluoride containing foods, such as black tea. Fluoride is a natural mineral that protects the teeth from tooth decay. Your caregiver may recommend fluoride toothpaste or a fluoride supplement.  Breast milk. DIETARY RECOMMENDATIONS FOR HEALTHY TEETH  Eat a healthy, well-balanced diet with fiber-rich fruits and vegetables and quality proteins (eggs, meat, poultry, and fish). A variety of foods each day in moderation is best.  Avoid frequent sugary snacks in between meals.  Avoid frequent sticky, chewy, sugary candies, such as gummy bears and other candies that stick to the teeth. Avoid sucking on candies for a long time.  Avoid drinks that contain added sugar. Even though they do not sit in the mouth for very long, they can promote tooth decay if consumed too frequently.  Avoid sugary foods and drinks late at night.  Avoid swishing or holding acidic or sugary drinks in your mouth. Using a straw limits contact with the teeth.  If you like frequent sugary treats, try eating a sugary dessert after a meal or with a dairy product, rather than eating it by itself.  Avoid starchy foods such as graham crackers that stick to your teeth.  Eat highly acidic and sugary foods in moderation, especially if you tend to develop tooth decay. Eat citrus fruits or drinks 2 times per day or less. Limit foods with vinegar and sports drinks to 1 time per week.  Try rinsing your mouth with water after a sugary or acidic meal or drink. Rinsing may help to reduce the acid buildup in the mouth.  Limit alcohol.  Read labels to determine the amount of sugar in  foods. PRACTICE GOOD DAILY ORAL HYGIENE   Have your teeth professionally cleaned at the dentist every 6 months.  Brush twice daily with a fluoride toothpaste.  Floss between your teeth daily.  Ask your caregiver if you need fluoride supplements or treatments.  Ask your caregiver if you should have sealants applied to some of your teeth. HOME CARE INSTRUCTIONS  Follow the guidelines included here to promote good oral health.  Follow all of your caregiver's instructions for managing your health condition(s).  See your caregiver for follow-up exams as directed. Document Released: 11/12/2010 Document Revised: 06/08/2011 Document Reviewed: 11/12/2010 [facility] Patient Information 2015 [place], [place]. This information is not intended to replace advice given to you by your health care provider. Make sure you discuss any questions you have  with your health care provider. ° °

## 2013-12-11 NOTE — ED Notes (Signed)
Patient states he has history of htn, but has not taken his meds this morning.

## 2013-12-11 NOTE — ED Notes (Signed)
Patient states that he has broken teeth and decayed teeth on L side.   Patient states went to dentist about a month ago who referred him to oral surgeon but he advised can't afford.

## 2013-12-11 NOTE — ED Provider Notes (Signed)
Medical screening examination/treatment/procedure(s) were performed by non-physician practitioner and as supervising physician I was immediately available for consultation/collaboration.  Carmin Muskrat, MD 12/11/13 [phone]

## 2013-12-11 NOTE — ED Provider Notes (Signed)
CSN: [id number]     Arrival date & time 12/11/13  8938 History  This chart was scribed for non-physician practitioner, Michele Mcalpine, PA-C,working with Carmin Muskrat, MD, by Marlowe Kays, ED Scribe. This patient was seen in room TR05C/TR05C and the patient's care was started at 9:37 AM.  No chief complaint on file.  The history is provided by the patient. No language interpreter was used.   HPI Comments:  Benjamin Stephenson is a 33 y.o. male with PMH of HTN, brain cancer, and seizures who presents to the Emergency Department complaining of  worsening left lower dental pain for the past month secondary to severe decay and broken teeth. He states upon waking today the pain was more severe. He reports associated HA and neck pain. He believes the left side of his face is swollen as well. Pt normally takes [employer] for the pain with moderate relief of the pain but states today it did not help at all. He denies nausea, vomiting and diarrhea.   Past Medical History  Diagnosis Date  . Seizures   . Shoulder dislocation, recurrent   . Hypertension   . Headache(784.0)   . Cancer     brain tumor   Past Surgical History  Procedure Laterality Date  . Brain tumor excision      left fronto-temporal glioma, s/p resection at [place] (Dr. Lissa Merlin)  . Abdominal surgery      at age of [age]  . Incision and drainage abscess Right 12/02/2012    Procedure: INCISION AND DRAINAGE ABSCESS RIGHT  GROIN;  Surgeon: Jamesetta So, MD;  Location: [facility];  Service: General;  Laterality: Right;   Family History  Problem Relation Age of Onset  . Diabetes    . Heart Problems    . Anxiety disorder    . Asthma    . Cancer    . Hypertension    . Multiple sclerosis Father    History  Substance Use Topics  . Smoking status: Current Every Day Smoker -- 1.00 packs/day    Types: Cigarettes  . Smokeless tobacco: Not on file  . Alcohol Use: Yes     Comment: occasional ( beer 2-3 times a week)    Review of Systems   HENT: Positive for dental problem.   All other systems reviewed and are negative.   Allergies  Erythromycin  Home Medications   Prior to Admission medications   Medication Sig Start Date End Date Taking? Authorizing Provider  amoxicillin (AMOXIL) 500 MG capsule Take 1 capsule (500 mg total) by mouth 3 (three) times daily. 12/11/13   Illene Labrador, PA-C  escitalopram (LEXAPRO) 10 MG tablet TAKE 1 TABLET BY MOUTH DAILY 07/07/13   Susy Frizzle, MD  hydrochlorothiazide (MICROZIDE) 12.5 MG capsule Take 1 capsule (12.5 mg total) by mouth daily. 04/07/13   Susy Frizzle, MD  HYDROcodone-acetaminophen (NORCO/VICODIN) 5-325 MG per tablet Take 1-2 tablets by mouth every 4 (four) hours as needed. 12/11/13   Illene Labrador, PA-C  imiquimod (ALDARA) 5 % cream Apply topically and leave on for 8 hours then rinse off 3 times per week until area is clear. 07/07/13   Alycia Rossetti, MD  lisinopril (PRINIVIL,ZESTRIL) 40 MG tablet Take 1 tablet (40 mg total) by mouth daily. 04/07/13   Susy Frizzle, MD  nicotine polacrilex (NICORETTE) 4 MG gum Take 1 each (4 mg total) by mouth as needed for smoking cessation. 03/06/13   Susy Frizzle, MD  Triage Vitals: BP 148/109  Pulse 98  Resp 18  Ht 6' (1.829 m)  Wt 200 lb (90.719 kg)  BMI 27.12 kg/m2  SpO2 99% Physical Exam  Nursing note and vitals reviewed. Constitutional: He is oriented to person, place, and time. He appears well-developed and well-nourished. No distress.  HENT:  Head: Normocephalic and atraumatic.  Mouth/Throat: No trismus in the jaw. Abnormal dentition. Dental caries present. No dental abscesses.  Lower left canine through third molar has surrounding erythema and edema with no abscess.  Eyes: Conjunctivae and EOM are normal.  Neck: Normal range of motion. Neck supple.  Cardiovascular: Normal rate, regular rhythm and normal heart sounds.   Pulmonary/Chest: Effort normal and breath sounds normal.  Musculoskeletal: Normal range of  motion. He exhibits no edema.  Lymphadenopathy:       Head (left side): Submandibular adenopathy present.  Neurological: He is alert and oriented to person, place, and time.  Skin: Skin is warm and dry.  Psychiatric: He has a normal mood and affect. His behavior is normal.    ED Course  Procedures (including critical care time) DIAGNOSTIC STUDIES: Oxygen Saturation is 99% on RA, normal by my interpretation.   COORDINATION OF CARE: 9:40 AM- Will refer to dentist and prescribe antibiotics. Pt verbalizes understanding and agrees to plan.  Medications - No data to display  Labs Review Labs Reviewed - No data to display  Imaging Review No results found.   EKG Interpretation None      MDM   Final diagnoses:  Dental caries  Tooth decay  Dental infection     Dental pain associated with dental infection. No evidence of dental abscess. Patient is afebrile, non toxic appearing and swallowing secretions well. I gave patient referral to dentist and stressed the importance of dental follow up for ultimate management of dental pain. I will also give amoxicillin and pain control. Patient voices understanding and is agreeable to plan.  I personally performed the services described in this documentation, which was scribed in my presence. The recorded information has been reviewed and is accurate.    Illene Labrador, PA-C 12/11/13 [phone]

## 2014-01-30 ENCOUNTER — Encounter: Payer: Self-pay | Admitting: Family Medicine

## 2014-01-30 ENCOUNTER — Ambulatory Visit (INDEPENDENT_AMBULATORY_CARE_PROVIDER_SITE_OTHER): Payer: Medicare Other | Admitting: Family Medicine

## 2014-01-30 VITALS — BP 158/106 | HR 78 | Temp 98.0°F | Resp 16 | Ht 70.0 in | Wt 198.0 lb

## 2014-01-30 DIAGNOSIS — F32A Depression, unspecified: Secondary | ICD-10-CM

## 2014-01-30 DIAGNOSIS — I10 Essential (primary) hypertension: Secondary | ICD-10-CM

## 2014-01-30 DIAGNOSIS — F329 Major depressive disorder, single episode, unspecified: Secondary | ICD-10-CM

## 2014-01-30 DIAGNOSIS — F419 Anxiety disorder, unspecified: Secondary | ICD-10-CM

## 2014-01-30 DIAGNOSIS — K047 Periapical abscess without sinus: Secondary | ICD-10-CM

## 2014-01-30 DIAGNOSIS — F418 Other specified anxiety disorders: Secondary | ICD-10-CM

## 2014-01-30 DIAGNOSIS — R569 Unspecified convulsions: Secondary | ICD-10-CM

## 2014-01-30 DIAGNOSIS — K088 Other specified disorders of teeth and supporting structures: Secondary | ICD-10-CM

## 2014-01-30 DIAGNOSIS — Z9189 Other specified personal risk factors, not elsewhere classified: Secondary | ICD-10-CM

## 2014-01-30 LAB — COMPREHENSIVE METABOLIC PANEL
ALBUMIN: 4.8 g/dL (ref 3.5–5.2)
ALK PHOS: 93 U/L (ref 39–117)
ALT: 17 U/L (ref 0–53)
AST: 16 U/L (ref 0–37)
BILIRUBIN TOTAL: 0.4 mg/dL (ref 0.2–1.2)
BUN: 8 mg/dL (ref 6–23)
CO2: 22 meq/L (ref 19–32)
Calcium: 9.3 mg/dL (ref 8.4–10.5)
Chloride: 103 mEq/L (ref 96–112)
Creat: 0.81 mg/dL (ref 0.50–1.35)
GLUCOSE: 79 mg/dL (ref 70–99)
POTASSIUM: 4.6 meq/L (ref 3.5–5.3)
SODIUM: 135 meq/L (ref 135–145)
TOTAL PROTEIN: 7.4 g/dL (ref 6.0–8.3)

## 2014-01-30 LAB — CBC WITH DIFFERENTIAL/PLATELET
BASOS ABS: 0 10*3/uL (ref 0.0–0.1)
BASOS PCT: 0 % (ref 0–1)
EOS ABS: 0.3 10*3/uL (ref 0.0–0.7)
EOS PCT: 3 % (ref 0–5)
HCT: 45.5 % (ref 39.0–52.0)
Hemoglobin: 15.9 g/dL (ref 13.0–17.0)
LYMPHS ABS: 2.7 10*3/uL (ref 0.7–4.0)
Lymphocytes Relative: 29 % (ref 12–46)
MCH: 27.9 pg (ref 26.0–34.0)
MCHC: 34.9 g/dL (ref 30.0–36.0)
MCV: 80 fL (ref 78.0–100.0)
Monocytes Absolute: 1 10*3/uL (ref 0.1–1.0)
Monocytes Relative: 11 % (ref 3–12)
NEUTROS PCT: 57 % (ref 43–77)
Neutro Abs: 5.4 10*3/uL (ref 1.7–7.7)
PLATELETS: 319 10*3/uL (ref 150–400)
RBC: 5.69 MIL/uL (ref 4.22–5.81)
RDW: 14.1 % (ref 11.5–15.5)
WBC: 9.4 10*3/uL (ref 4.0–10.5)

## 2014-01-30 LAB — LIPID PANEL
CHOL/HDL RATIO: 5 ratio
CHOLESTEROL: 205 mg/dL — AB (ref 0–200)
HDL: 41 mg/dL (ref >39)
LDL Cholesterol: 124 mg/dL — ABNORMAL HIGH (ref 0–99)
Triglycerides: 199 mg/dL — ABNORMAL HIGH (ref <150)
VLDL: 40 mg/dL (ref 0–40)

## 2014-01-31 DIAGNOSIS — F329 Major depressive disorder, single episode, unspecified: Secondary | ICD-10-CM | POA: Insufficient documentation

## 2014-01-31 DIAGNOSIS — Z9189 Other specified personal risk factors, not elsewhere classified: Secondary | ICD-10-CM | POA: Insufficient documentation

## 2014-01-31 DIAGNOSIS — I10 Essential (primary) hypertension: Secondary | ICD-10-CM | POA: Insufficient documentation

## 2014-01-31 DIAGNOSIS — K047 Periapical abscess without sinus: Secondary | ICD-10-CM | POA: Insufficient documentation

## 2014-01-31 DIAGNOSIS — F419 Anxiety disorder, unspecified: Secondary | ICD-10-CM | POA: Insufficient documentation

## 2014-01-31 DIAGNOSIS — F32A Depression, unspecified: Secondary | ICD-10-CM | POA: Insufficient documentation

## 2014-01-31 MED ORDER — SERTRALINE HCL 50 MG PO TABS: 100.0000 mg | ORAL_TABLET | Freq: Every day | ORAL | Status: DC

## 2014-01-31 MED ORDER — HYDROCHLOROTHIAZIDE 25 MG PO TABS: 25.0000 mg | ORAL_TABLET | Freq: Every day | ORAL | Status: AC

## 2014-01-31 MED ORDER — HYDROCODONE-ACETAMINOPHEN 5-325 MG PO TABS: 1.0000 | ORAL_TABLET | ORAL | Status: DC | PRN

## 2014-01-31 MED ORDER — AMOXICILLIN 500 MG PO CAPS: 500.0000 mg | ORAL_CAPSULE | Freq: Three times a day (TID) | ORAL | Status: DC

## 2014-01-31 NOTE — Assessment & Plan Note (Signed)
He is followed by [facility] he's been on disability since 07/18/2013 I did complete the form for him so he can get on his mother's dental insurance

## 2014-01-31 NOTE — Patient Instructions (Signed)
Note average computer system was down therefore he was not given his after visit summary

## 2014-01-31 NOTE — Progress Notes (Signed)
Patient ID: Benjamin Stephenson, male   DOB: 1980/06/16, 33 y.o.   MRN: [id number]   Subjective:    Patient ID: Benjamin Stephenson, male    DOB: [birth year], 62 y.o.   MRN: [id number]  Patient presents for Discuss BP; Insomnia; Agitation; and Dental Forms patient here with multiple concerns. His blood pressure has been elevated at home he has been taken his lisinopril and his hydrochlorothiazide states he has been in severe dental pain. At home his blood pressure was 150/118 he also had elevated blood pressure at his follow-up appointment with his neurologist as he has a seizure disorder.  Dental pain he has had abscessed tooth for a couple months he needs to see an oral surgeon to have 2 teeth removed he has 1 tooth that is impacted into the gumline. He's had significant swelling and pain of his teeth over the past couple weeks he has not been on any antibiotics for a couple months for this. He also does not have any pain medication. He has been unable to eat or drink or brush his teeth for the past 3 days secondary to the pain. He has a form today which she can be added to his mother's insurance secondary to him being on disability they will cover him dental-wise and allow him to see the oral surgeon.  Anxiety and insomnia he has suffered with anxiety and insomnia worsening of the past year and a half as he has been unable to work    Review Of Systems:  GEN- denies fatigue, fever, weight loss,weakness, recent illness HEENT- denies eye drainage, change in vision, nasal discharge, CVS- denies chest pain, palpitations RESP- denies SOB, cough, wheeze ABD- denies N/V, change in stools, abd pain GU- denies dysuria, hematuria, dribbling, incontinence MSK- denies joint pain, muscle aches, injury Neuro- denies headache, dizziness, syncope, seizure activity       Objective:    BP 158/106 mmHg  Pulse 78  Temp(Src) 98 F (36.7 C) (Oral)  Resp 16  Ht 5\' 10"  (1.778 m)  Wt 198 lb (89.812 kg)  BMI  28.41 kg/m2 GEN- NAD, alert and oriented x3 HEENT- PERRL, EOMI, non injected sclera, pink conjunctiva, MMM, oropharynx -poor dentition, swelling erythema along left lower gumline, impacted tooth, TTP, foul odor Neck- Supple, no LAD CVS- RRR, no murmur RESP-CTAB Psych- anxious appearing, not depressed, no SI, normal speech EXT- No edema Pulses- Radial +        Assessment & Plan:      Problem List Items Addressed This Visit    None    Visit Diagnoses    Essential hypertension, benign    -  Primary    Relevant Orders       Comprehensive metabolic panel (Completed)       Lipid panel       CBC with Differential (Completed)       Lipid panel       Note: This dictation was prepared with Dragon dictation along with smaller phrase technology. Any transcriptional errors that result from this process are unintentional.

## 2014-01-31 NOTE — Assessment & Plan Note (Signed)
Blood pressure uncontrolled however he is in significant pain today with his abscessed tooth. We'll increase his hydrochlorothiazide to 25 mg he will continue the lisinopril at the current dose we will go ahead and get his fasting labs today she will return for blood pressure follow-up

## 2014-01-31 NOTE — Assessment & Plan Note (Signed)
Taper off lexapro, decrease to 10 mg for one week then discontinue. She will start Zoloft 50 mg he will take this for T weeks then increase to 100 mg follow-up in 4 weeks

## 2014-01-31 NOTE — Assessment & Plan Note (Addendum)
amox x 10 days Needs surgical removal of teeth, understands this is temporary Norco given short term

## 2014-02-13 ENCOUNTER — Ambulatory Visit: Payer: Medicare Other | Admitting: Family Medicine

## 2014-06-06 ENCOUNTER — Encounter: Payer: Self-pay | Admitting: Family Medicine

## 2014-07-09 ENCOUNTER — Encounter: Payer: Self-pay | Admitting: Family Medicine

## 2014-07-10 ENCOUNTER — Other Ambulatory Visit: Payer: Self-pay | Admitting: Family Medicine

## 2014-07-10 MED ORDER — LISINOPRIL 40 MG PO TABS: 40.0000 mg | ORAL_TABLET | Freq: Every day | ORAL | Status: DC

## 2014-07-10 MED ORDER — LISINOPRIL 40 MG PO TABS: 40.0000 mg | ORAL_TABLET | Freq: Every day | ORAL | Status: AC

## 2014-07-10 NOTE — Telephone Encounter (Signed)
Med sent to pharm 

## 2014-07-10 NOTE — Telephone Encounter (Signed)
[facility]???  LMTCB for pt.  Where is pharmacy??

## 2014-07-10 NOTE — Telephone Encounter (Signed)
[phone] [facility] Pt is needing a refill on Lisinopril 40 MG Tablet

## 2014-07-25 ENCOUNTER — Ambulatory Visit (INDEPENDENT_AMBULATORY_CARE_PROVIDER_SITE_OTHER): Payer: Medicare Other | Admitting: Physician Assistant

## 2014-07-25 ENCOUNTER — Encounter: Payer: Self-pay | Admitting: Physician Assistant

## 2014-07-25 VITALS — BP 172/120 | HR 72 | Temp 97.5°F | Resp 20 | Wt 207.0 lb

## 2014-07-25 DIAGNOSIS — I10 Essential (primary) hypertension: Secondary | ICD-10-CM

## 2014-07-25 DIAGNOSIS — K088 Other specified disorders of teeth and supporting structures: Secondary | ICD-10-CM

## 2014-07-25 DIAGNOSIS — K047 Periapical abscess without sinus: Secondary | ICD-10-CM

## 2014-07-25 DIAGNOSIS — Z9189 Other specified personal risk factors, not elsewhere classified: Secondary | ICD-10-CM

## 2014-07-25 MED ORDER — AMOXICILLIN 500 MG PO CAPS: 500.0000 mg | ORAL_CAPSULE | Freq: Three times a day (TID) | ORAL | Status: DC

## 2014-07-25 MED ORDER — AMLODIPINE BESYLATE 5 MG PO TABS: 5.0000 mg | ORAL_TABLET | Freq: Every day | ORAL | Status: DC

## 2014-07-25 MED ORDER — HYDROCODONE-ACETAMINOPHEN 5-325 MG PO TABS: 1.0000 | ORAL_TABLET | ORAL | Status: DC | PRN

## 2014-07-25 MED ORDER — CLONIDINE HCL 0.1 MG PO TABS
0.1000 mg | ORAL_TABLET | Freq: Once | ORAL | Status: AC
  Administered 2014-07-25: 0.1 mg via ORAL

## 2014-07-25 NOTE — Progress Notes (Signed)
Patient ID: ALOYSIUS HEINLE MRN: [id number], DOB: 1980-12-17, 34 y.o. Date of Encounter: @DATE @  Chief Complaint:  Chief Complaint  Patient presents with  . has abcessed tooth    doesn't have co pay for dentist    HPI: 34 y.o. year old male  presents with above complaint.  Patient states that he saw the dentist 3 months ago. New at that time that he has multiple dental problems that need to be fixed. However at that time had no dental insurance. Patient is on disability and Medicare but does not have dental coverage with that. Just recently gotten on his mother's insurance. However currently does not have the money to pay the co-pay but says that he will have this amount by May 1.  Needs some medication to use in the antrum until he can go have his teeth extracted once he has that money May 1.  In epic I reviewed that he had an ER visit 12/11/13 secondary to dental infection. Also reviewed the had an office visit with Dr. Buelah Manis 01/30/14-at that time also had dental infection. Treated with amoxicillin for 10 days and Norco 5/325 #45.  Patient states that he has had brain surgery and ALT other types of surgeries and that this is the worst pain he has ever had.   Past Medical History  Diagnosis Date  . Seizures   . Shoulder dislocation, recurrent   . Hypertension   . Headache(784.0)   . Cancer     brain tumor     Home Meds: Outpatient Prescriptions Prior to Visit  Medication Sig Dispense Refill  . hydrochlorothiazide (HYDRODIURIL) 25 MG tablet Take 1 tablet (25 mg total) by mouth daily. 30 tablet 6  . lisinopril (PRINIVIL,ZESTRIL) 40 MG tablet Take 1 tablet (40 mg total) by mouth daily. 30 tablet 6  . sertraline (ZOLOFT) 50 MG tablet Take 2 tablets (100 mg total) by mouth at bedtime. 60 tablet 3  . escitalopram (LEXAPRO) 10 MG tablet Take 10 mg by mouth daily.    Marland Kitchen amoxicillin (AMOXIL) 500 MG capsule Take 1 capsule (500 mg total) by mouth 3 (three) times daily. 30 capsule 0   . HYDROcodone-acetaminophen (NORCO/VICODIN) 5-325 MG per tablet Take 1 tablet by mouth every 4 (four) hours as needed. (Patient not taking: Reported on 07/25/2014) 45 tablet 0   No facility-administered medications prior to visit.    Allergies: No Known Allergies  History   Social History  . Marital Status: Divorced    Spouse Name: N/A  . Number of Children: N/A  . Years of Education: N/A   Occupational History  . unemployed    Social History Main Topics  . Smoking status: Current Every Day Smoker -- 1.00 packs/day    Types: Cigarettes  . Smokeless tobacco: Not on file  . Alcohol Use: Yes     Comment: occasional ( beer 2-3 times a week)  . Drug Use: 7.00 per week    Special: Marijuana  . Sexual Activity: Not on file   Other Topics Concern  . Not on file   Social History Narrative    Family History  Problem Relation Age of Onset  . Diabetes    . Heart Problems    . Anxiety disorder    . Asthma    . Cancer    . Hypertension    . Multiple sclerosis Father      Review of Systems:  See HPI for pertinent ROS. All other ROS negative.  Physical Exam: Blood pressure 172/120, pulse 72, temperature 97.5 F (36.4 C), temperature source Oral, resp. rate 20, weight 207 lb (93.895 kg)., Body mass index is 29.7 kg/(m^2). General: WNWD WM. Appears in mild distress. Head:Very poor dentition with multiple caries. Neck: Supple. No thyromegaly. No lymphadenopathy. Lungs: Clear bilaterally to auscultation without wheezes, rales, or rhonchi. Breathing is unlabored. Heart: RRR with S1 S2. No murmurs, rubs, or gallops. Musculoskeletal:  Strength and tone normal for age. Extremities/Skin: Warm and dry. Neuro: Alert and oriented X 3. Moves all extremities spontaneously. Gait is normal. CNII-XII grossly in tact. Psych:  Responds to questions appropriately with a normal affect.     ASSESSMENT AND PLAN:  34 y.o. year old male with  1. Tooth abscess - amoxicillin (AMOXIL) 500 MG  capsule; Take 1 capsule (500 mg total) by mouth 3 (three) times daily.  Dispense: 30 capsule; Refill: 0 - HYDROcodone-acetaminophen (NORCO/VICODIN) 5-325 MG per tablet; Take 1 tablet by mouth every 4 (four) hours as needed.  Dispense: 45 tablet; Refill: 0  2. Poor dental hygiene  3. Essential hypertension Blood Pressure is very high today. Gave him clonidine 0.1 mg 1 here in the office. Blood pressure did decrease to 162/108 prior to patient leaving the office.  I reviewed blood pressure at prior visits here. I don't know how much of his high blood pressure today is caused by his severe pain. His last visit here was 01/30/14 and blood pressure was 158/106 at that time. Her at that time he also was in pain secondary to these dental problems. Prior to that it been a long time since his prior visit so I do not know what his blood pressure usually is when he is having no pain.  He is having no angina symptoms and no TIAs/CVA type symptoms.  Will add Norvasc 5 mg daily to his other daily blood pressure medications. He will schedule follow-up visit here in one week to recheck blood pressure when his pain is controlled. - amLODipine (NORVASC) 5 MG tablet; Take 1 tablet (5 mg total) by mouth daily.  Dispense: 30 tablet; Refill: 0   Signed, [address] [place], [place], [facility] 07/25/2014 10:29 AM

## 2014-07-25 NOTE — Addendum Note (Signed)
Addended by: Olena Mater on: 07/25/2014 11:03 AM   Modules accepted: Orders

## 2014-08-01 ENCOUNTER — Ambulatory Visit: Payer: Medicare Other | Admitting: Physician Assistant

## 2014-08-21 ENCOUNTER — Other Ambulatory Visit: Payer: Self-pay | Admitting: Family Medicine

## 2014-08-22 ENCOUNTER — Other Ambulatory Visit: Payer: Self-pay | Admitting: Physician Assistant

## 2014-08-22 NOTE — Telephone Encounter (Signed)
Medication refilled per protocol. 

## 2014-09-06 ENCOUNTER — Encounter (HOSPITAL_COMMUNITY): Payer: Self-pay | Admitting: Cardiology

## 2014-09-06 ENCOUNTER — Emergency Department (HOSPITAL_COMMUNITY)
Admission: EM | Admit: 2014-09-06 | Discharge: 2014-09-06 | Disposition: A | Discharge status: Home / Self Care | Payer: Medicare Other | Attending: Emergency Medicine | Admitting: Emergency Medicine

## 2014-09-06 DIAGNOSIS — R110 Nausea: Secondary | ICD-10-CM | POA: Diagnosis not present

## 2014-09-06 DIAGNOSIS — G40909 Epilepsy, unspecified, not intractable, without status epilepticus: Secondary | ICD-10-CM | POA: Insufficient documentation

## 2014-09-06 DIAGNOSIS — Z86011 Personal history of benign neoplasm of the brain: Secondary | ICD-10-CM | POA: Diagnosis not present

## 2014-09-06 DIAGNOSIS — Z87828 Personal history of other (healed) physical injury and trauma: Secondary | ICD-10-CM | POA: Diagnosis not present

## 2014-09-06 DIAGNOSIS — Z79899 Other long term (current) drug therapy: Secondary | ICD-10-CM | POA: Insufficient documentation

## 2014-09-06 DIAGNOSIS — I10 Essential (primary) hypertension: Secondary | ICD-10-CM | POA: Diagnosis not present

## 2014-09-06 DIAGNOSIS — H9202 Otalgia, left ear: Secondary | ICD-10-CM | POA: Insufficient documentation

## 2014-09-06 DIAGNOSIS — Z720 Tobacco use: Secondary | ICD-10-CM | POA: Insufficient documentation

## 2014-09-06 DIAGNOSIS — Z792 Long term (current) use of antibiotics: Secondary | ICD-10-CM | POA: Diagnosis not present

## 2014-09-06 DIAGNOSIS — R0981 Nasal congestion: Secondary | ICD-10-CM | POA: Diagnosis present

## 2014-09-06 DIAGNOSIS — J40 Bronchitis, not specified as acute or chronic: Secondary | ICD-10-CM

## 2014-09-06 DIAGNOSIS — R519 Headache, unspecified: Secondary | ICD-10-CM

## 2014-09-06 DIAGNOSIS — R51 Headache: Secondary | ICD-10-CM | POA: Diagnosis not present

## 2014-09-06 MED ORDER — PHENYLEPH-PROMETHAZINE-COD 5-6.25-10 MG/5ML PO SYRP: 5.0000 mL | ORAL_SOLUTION | Freq: Three times a day (TID) | ORAL | Status: DC | PRN

## 2014-09-06 MED ORDER — AZITHROMYCIN 250 MG PO TABS
250.0000 mg | ORAL_TABLET | Freq: Every day | ORAL | Status: DC
Start: 2014-09-06 — End: 2017-09-11

## 2014-09-06 MED ORDER — PSEUDOEPHEDRINE HCL 30 MG PO TABS: 30.0000 mg | ORAL_TABLET | Freq: Four times a day (QID) | ORAL | Status: DC | PRN

## 2014-09-06 NOTE — ED Provider Notes (Signed)
CSN: [id number]     Arrival date & time 09/06/14  1321 History  This chart was scribed for non-physician practitioner Debroah Baller, NP working with Charlesetta Shanks, MD by Meriel Pica, ED Scribe. This patient was seen in room TR07C/TR07C and the patient's care was started at 2:25 PM.   Chief Complaint  Patient presents with  . Cough  . Nasal Congestion   Patient is a 34 y.o. male presenting with cough. The history is provided by the patient. No language interpreter was used.  Cough Cough characteristics:  Productive Sputum characteristics:  Green and yellow Severity:  Moderate Onset quality:  Gradual Duration:  3 days Timing:  Constant Progression:  Worsening Chronicity:  New Smoker: yes   Relieved by:  Nothing Associated symptoms: chills, ear pain, fever, headaches, myalgias and sinus congestion    HPI Comments: Benjamin Stephenson is a 34 y.o. male, with a PMhx of cancer, seizures, HTN, and migraines, who presents to the Emergency Department complaining of constant, moderate, gradually worsening cough and myalgias onset 7 days ago with associated subjective fever, headache, left otalgia pain, chills, nausea, and a productive cough with green/yellow sputum onset 2 days ago. Pt is a current smoker, 1ppd. He notes he has taken one 200 mg ibuprofen pta with no relief. He has experienced similar symptoms in the past when he was diagnosed with bronchitis. Pt is on disability for seizure disorder and is prescribed medication for narcolepsy. He denies vomiting or abdominal pain.   Past Medical History  Diagnosis Date  . Seizures   . Shoulder dislocation, recurrent   . Hypertension   . Headache(784.0)   . Cancer     brain tumor   Past Surgical History  Procedure Laterality Date  . 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 Lissa Merlin)  . Abdominal surgery      at age of [age]  . Incision and drainage abscess Right 12/02/2012    Procedure: INCISION AND  DRAINAGE ABSCESS RIGHT  GROIN;  Surgeon: Jamesetta So, MD;  Location: [facility];  Service: General;  Laterality: Right;   Family History  Problem Relation Age of Onset  . Diabetes    . Heart Problems    . Anxiety disorder    . Asthma    . Cancer    . Hypertension    . Multiple sclerosis Father    History  Substance Use Topics  . Smoking status: Current Every Day Smoker -- 1.00 packs/day    Types: Cigarettes  . Smokeless tobacco: Not on file  . Alcohol Use: Yes     Comment: occasional ( beer 2-3 times a week)    Review of Systems  Constitutional: Positive for fever and chills.  HENT: Positive for ear pain.   Respiratory: Positive for cough.   Gastrointestinal: Positive for nausea. Negative for vomiting and abdominal pain.  Musculoskeletal: Positive for myalgias.  Neurological: Positive for headaches.      Allergies  Review of patient's allergies indicates no known allergies.  Home Medications   Prior to Admission medications   Medication Sig Start Date End Date Taking? Authorizing Provider  amLODipine (NORVASC) 5 MG tablet TAKE 1 TABLET EVERY DAY 08/22/14   Orlena Sheldon, PA-C  azithromycin (ZITHROMAX) 250 MG tablet Take 1 tablet (250 mg total) by mouth daily. Take first 2 tablets together, then 1 every day until finished. 09/06/14   Jaquana Geiger Bunnie Pion, NP  escitalopram (LEXAPRO) 10 MG tablet  Take 10 mg by mouth daily.    Historical Provider, MD  hydrochlorothiazide (HYDRODIURIL) 25 MG tablet Take 1 tablet (25 mg total) by mouth daily. 01/31/14   Alycia Rossetti, MD  HYDROcodone-acetaminophen (NORCO/VICODIN) 5-325 MG per tablet Take 1 tablet by mouth every 4 (four) hours as needed. 07/25/14   Lonie Peak Dixon, PA-C  lisinopril (PRINIVIL,ZESTRIL) 40 MG tablet Take 1 tablet (40 mg total) by mouth daily. 07/10/14   Susy Frizzle, MD  Phenyleph-Promethazine-Cod 5-6.25-10 MG/5ML SYRP Take 5 mLs by mouth every 8 (eight) hours as needed. 09/06/14   Quindarius Cabello Bunnie Pion, NP  pseudoephedrine (SUDAFED) 30  MG tablet Take 1 tablet (30 mg total) by mouth every 6 (six) hours as needed for congestion. 09/06/14   Loria Lacina Bunnie Pion, NP  sertraline (ZOLOFT) 50 MG tablet TAKE 2 TABLETS BY MOUTH AT BEDTIME AS NEEDED FOR ANXIETY 08/22/14   Susy Frizzle, MD   BP 120/86 mmHg  Pulse 129  Temp(Src) 97.6 F (36.4 C) (Oral)  Resp 18  Ht 6' (1.829 m)  Wt 205 lb (92.987 kg)  BMI 27.80 kg/m2  SpO2 98%  Physical Exam  Constitutional: He is oriented to person, place, and time. He appears well-developed and well-nourished. No distress.  HENT:  Head: Normocephalic.  Right Ear: External ear normal.  Left Ear: External ear normal. Tympanic membrane is retracted.  Nose: Left sinus exhibits maxillary sinus tenderness.  Mouth/Throat: No oropharyngeal exudate.  Left TM dull.      Eyes: EOM are normal. Pupils are equal, round, and reactive to light. Right eye exhibits no discharge. Left eye exhibits no discharge. No scleral icterus.  Neck: Normal range of motion. Neck supple.  Cardiovascular: Normal rate, regular rhythm and normal heart sounds.   Pulmonary/Chest: Effort normal and breath sounds normal. No respiratory distress.  Occasional rhonchi.   Abdominal: Soft. There is no tenderness.  Musculoskeletal: Normal range of motion. He exhibits no edema.  Lymphadenopathy:    He has no cervical adenopathy.  Neurological: He is alert and oriented to person, place, and time.  Skin: Skin is warm and dry. No rash noted.  Psychiatric: He has a normal mood and affect. His behavior is normal.  Nursing note and vitals reviewed.   ED Course  Procedures  DIAGNOSTIC STUDIES: Oxygen Saturation is 98% on RA, normal by my interpretation.    COORDINATION OF CARE: 2:29 PM Discussed treatment plan with pt. Encouraged pt to consider smoking cessation and cautioned against associated COPD. Discussed getting a chest X-ray but the patient reports he has had one within the past 4 months. Will treat patient for symptoms as he is  opposed to exposure to additional radiation. Pt acknowledges and agrees to plan.   Labs Review Labs Reviewed - No data to display  Imaging Review No results found.   EKG Interpretation None      MDM  34 y.o. male with cough and congestion x 2 days. Will treat for bronchitis and sinus headache. He will follow up with his PCP. Stable for d/c without respiratory distress 02 SAT 96% on R/A, afebrile at time of d/c. Discussed with the patient and all questioned fully answered. He will return if any problems arise.  Final diagnoses:  Bronchitis  Sinus headache   I personally performed the services described in this documentation, which was scribed in my presence. The recorded information has been reviewed and is accurate.   [address] [place], NP 09/09/14 0005  Charlesetta Shanks, MD 09/10/14 [phone]

## 2014-09-06 NOTE — ED Notes (Signed)
Pt reports a cough and congestion for the past couple of days. Has tried medication at home without much relief.

## 2014-09-26 ENCOUNTER — Other Ambulatory Visit: Payer: Self-pay | Admitting: Physician Assistant

## 2014-09-26 NOTE — Telephone Encounter (Signed)
Medication refilled per protocol. 

## 2014-10-13 ENCOUNTER — Other Ambulatory Visit: Payer: Self-pay | Admitting: Physician Assistant

## 2014-10-16 NOTE — Telephone Encounter (Signed)
Medication refilled per protocol. 

## 2015-01-01 ENCOUNTER — Encounter: Payer: Self-pay | Admitting: Family Medicine

## 2015-02-04 ENCOUNTER — Encounter: Payer: Self-pay | Admitting: Family Medicine

## 2017-09-11 ENCOUNTER — Other Ambulatory Visit: Payer: Self-pay

## 2017-09-11 ENCOUNTER — Emergency Department (HOSPITAL_COMMUNITY): Payer: Medicare Other

## 2017-09-11 ENCOUNTER — Encounter (HOSPITAL_COMMUNITY): Payer: Self-pay | Admitting: Emergency Medicine

## 2017-09-11 ENCOUNTER — Emergency Department (HOSPITAL_COMMUNITY)
Admission: EM | Admit: 2017-09-11 | Discharge: 2017-09-11 | Disposition: A | Discharge status: Home / Self Care | Payer: Medicare Other | Attending: Emergency Medicine | Admitting: Emergency Medicine

## 2017-09-11 DIAGNOSIS — M25511 Pain in right shoulder: Secondary | ICD-10-CM | POA: Diagnosis not present

## 2017-09-11 DIAGNOSIS — Z87898 Personal history of other specified conditions: Secondary | ICD-10-CM | POA: Diagnosis not present

## 2017-09-11 DIAGNOSIS — R1031 Right lower quadrant pain: Secondary | ICD-10-CM | POA: Insufficient documentation

## 2017-09-11 DIAGNOSIS — R51 Headache: Secondary | ICD-10-CM | POA: Insufficient documentation

## 2017-09-11 DIAGNOSIS — R109 Unspecified abdominal pain: Secondary | ICD-10-CM

## 2017-09-11 DIAGNOSIS — F17210 Nicotine dependence, cigarettes, uncomplicated: Secondary | ICD-10-CM | POA: Insufficient documentation

## 2017-09-11 DIAGNOSIS — R10A1 Flank pain, right side: Secondary | ICD-10-CM

## 2017-09-11 DIAGNOSIS — I10 Essential (primary) hypertension: Secondary | ICD-10-CM | POA: Diagnosis not present

## 2017-09-11 DIAGNOSIS — Z79899 Other long term (current) drug therapy: Secondary | ICD-10-CM | POA: Insufficient documentation

## 2017-09-11 LAB — COMPREHENSIVE METABOLIC PANEL
ALBUMIN: 3.6 g/dL (ref 3.5–5.0)
ALT: 12 U/L — AB (ref 17–63)
AST: 14 U/L — AB (ref 15–41)
Alkaline Phosphatase: 90 U/L (ref 38–126)
BUN: 8 mg/dL (ref 6–20)
CALCIUM: 8.9 mg/dL (ref 8.9–10.3)
CREATININE: 0.6 mg/dL — AB (ref 0.61–1.24)
Chloride: 107 mmol/L (ref 101–111)
GFR calc Af Amer: >60 mL/min (ref >60)
GFR calc non Af Amer: >60 mL/min (ref >60)
Glucose, Bld: 103 mg/dL — ABNORMAL HIGH (ref 65–99)
Potassium: 3.9 mmol/L (ref 3.5–5.1)
Sodium: 140 mmol/L (ref 135–145)
Total Bilirubin: 0.3 mg/dL (ref 0.3–1.2)
Total Protein: 6.6 g/dL (ref 6.5–8.1)

## 2017-09-11 LAB — URINALYSIS, ROUTINE W REFLEX MICROSCOPIC
BILIRUBIN URINE: NEGATIVE
Glucose, UA: NEGATIVE mg/dL
HGB URINE DIPSTICK: NEGATIVE
KETONES UR: NEGATIVE mg/dL
Leukocytes, UA: NEGATIVE
Nitrite: NEGATIVE
PROTEIN: NEGATIVE mg/dL
Specific Gravity, Urine: 1.005 (ref 1.005–1.030)
pH: 7 (ref 5.0–8.0)

## 2017-09-11 LAB — CBC WITH DIFFERENTIAL/PLATELET
BASOS ABS: 0 10*3/uL (ref 0.0–0.1)
Basophils Relative: 0 %
EOS ABS: 0.2 10*3/uL (ref 0.0–0.7)
EOS PCT: 2 %
HCT: 42.3 % (ref 39.0–52.0)
Hemoglobin: 13.4 g/dL (ref 13.0–17.0)
LYMPHS PCT: 20 %
Lymphs Abs: 2.2 10*3/uL (ref 0.7–4.0)
MCH: 26.5 pg (ref 26.0–34.0)
MCHC: 31.7 g/dL (ref 30.0–36.0)
MCV: 83.8 fL (ref 78.0–100.0)
MONO ABS: 0.7 10*3/uL (ref 0.1–1.0)
Monocytes Relative: 6 %
Neutro Abs: 8 10*3/uL — ABNORMAL HIGH (ref 1.7–7.7)
Neutrophils Relative %: 72 %
Platelets: 361 10*3/uL (ref 150–400)
RBC: 5.05 MIL/uL (ref 4.22–5.81)
RDW: 14.5 % (ref 11.5–15.5)
WBC: 11.1 10*3/uL — AB (ref 4.0–10.5)

## 2017-09-11 LAB — LIPASE, BLOOD: Lipase: 23 U/L (ref 11–51)

## 2017-09-11 LAB — D-DIMER, QUANTITATIVE: D-Dimer, Quant: 0.31 ug/mL-FEU (ref 0.00–0.50)

## 2017-09-11 MED ORDER — IOPAMIDOL (ISOVUE-370) INJECTION 76%
100.0000 mL | Freq: Once | INTRAVENOUS | Status: AC | PRN
  Administered 2017-09-11: 100 mL via INTRAVENOUS

## 2017-09-11 NOTE — ED Triage Notes (Signed)
Pt c/o lower back pain that moves down to front of right leg. Pt was admitted 3-4 days ago for seizures. Pt also c/o burning to both sides of ribs.

## 2017-09-11 NOTE — Discharge Instructions (Signed)
Tests showed no life-threatening condition.  Follow-up with your primary care doctor. 

## 2017-09-11 NOTE — ED Provider Notes (Signed)
[facility] EMERGENCY DEPARTMENT Provider Note   CSN: [id number] Arrival date & time: 09/11/17  5277     History   Chief Complaint Chief Complaint  Patient presents with  . Back Pain    HPI Benjamin Stephenson is a 37 y.o. male.  Patient complains of a 12-hour history of right-sided low back pain that radiates around his right abdomen and down his anterior right leg.  Reports this pain is constant. The pain came on while he was cooking at home.  He has not had this kind of pain in the past.  He denies any midline back pain.  No weakness or numbness in his leg.  No bowel or bladder incontinence.  No fever or vomiting.  No chest pain or shortness of breath.  He does have bilateral axillary pain for the past several days that he attributes to having seizures last week but denies any specific trauma. He does have a history of a brain tumor that has been resected but according to him is slowly growing again.  He is no longer on seizure medication because he has not seen his neurologist for several years.  He is also not had his blood pressure medication for several days.  Denies any pain with urination or blood in the urine.  He was seen at an outside hospital last week for questionable seizures.  He also underwent a appendectomy about 5 weeks ago at an outside hospital for what he states was gangrenous appendicitis.  The history is provided by the patient.    Past Medical History:  Diagnosis Date  . Cancer [facility])    brain tumor  . Headache(784.0)   . Hypertension   . Seizures ([place])   . Shoulder dislocation, recurrent     Patient Active Problem List   Diagnosis Date Noted  . Tooth abscess 01/31/2014  . Poor dental hygiene 01/31/2014  . Essential hypertension 01/31/2014  . Anxiety and depression 01/31/2014  . Viral URI 01/11/2013  . Genital warts 01/11/2013  . Tobacco abuse 12/01/2012  . Glioma of brain ([place]) 07/12/2012  . Seizures ([place]) 07/12/2012  . Headache(784.0) 07/12/2012      Past Surgical History:  Procedure Laterality Date  . ABDOMINAL SURGERY     at age of [age]  . BRAIN TUMOR EXCISION     left fronto-temporal glioma, s/p resection at [place] (Dr. Lissa Merlin)  . INCISION AND DRAINAGE ABSCESS Right 12/02/2012   Procedure: INCISION AND DRAINAGE ABSCESS RIGHT  GROIN;  Surgeon: Jamesetta So, MD;  Location: [facility];  Service: General;  Laterality: Right;        Home Medications    Prior to Admission medications   Medication Sig Start Date End Date Taking? Authorizing Provider  amLODipine (NORVASC) 5 MG tablet TAKE 1 TABLET EVERY DAY 10/16/14   Susy Frizzle, MD  azithromycin (ZITHROMAX) 250 MG tablet Take 1 tablet (250 mg total) by mouth daily. Take first 2 tablets together, then 1 every day until finished. 09/06/14   Ashley Murrain, NP  escitalopram (LEXAPRO) 10 MG tablet Take 10 mg by mouth daily.    [provider]  hydrochlorothiazide (HYDRODIURIL) 25 MG tablet Take 1 tablet (25 mg total) by mouth daily. 01/31/14   Alycia Rossetti, MD  HYDROcodone-acetaminophen (NORCO/VICODIN) 5-325 MG per tablet Take 1 tablet by mouth every 4 (four) hours as needed. 07/25/14   Orlena Sheldon, PA-C  lisinopril (PRINIVIL,ZESTRIL) 40 MG tablet Take 1 tablet (40 mg total) by mouth daily.  07/10/14   Susy Frizzle, MD  Phenyleph-Promethazine-Cod 5-6.25-10 MG/5ML SYRP Take 5 mLs by mouth every 8 (eight) hours as needed. 09/06/14   Ashley Murrain, NP  pseudoephedrine (SUDAFED) 30 MG tablet Take 1 tablet (30 mg total) by mouth every 6 (six) hours as needed for congestion. 09/06/14   Ashley Murrain, NP  sertraline (ZOLOFT) 50 MG tablet TAKE 2 TABLETS BY MOUTH AT BEDTIME AS NEEDED FOR ANXIETY 08/22/14   Susy Frizzle, MD    Family History Family History  Problem Relation Age of Onset  . Multiple sclerosis Father   . Diabetes Unknown   . Heart Problems Unknown   . Anxiety disorder Unknown   . Asthma Unknown   . Cancer Unknown   . Hypertension Unknown     Social  History Social History   Tobacco Use  . Smoking status: Current Every Day Smoker    Packs/day: 1.00    Types: Cigarettes  Substance Use Topics  . Alcohol use: Yes    Comment: occasional ( beer 2-3 times a week)  . Drug use: Yes    Frequency: 7.0 times per week    Types: Marijuana     Allergies   Patient has no known allergies.   Review of Systems Review of Systems  Constitutional: Negative for activity change, appetite change, fatigue and fever.  HENT: Negative for congestion and rhinorrhea.   Eyes: Negative for visual disturbance.  Respiratory: Negative for choking, chest tightness and shortness of breath.   Cardiovascular: Negative for chest pain.  Gastrointestinal: Positive for abdominal pain. Negative for nausea and vomiting.  Genitourinary: Positive for flank pain. Negative for dysuria, frequency, hematuria, testicular pain and urgency.  Musculoskeletal: Positive for back pain.  Skin: Negative for rash.  Neurological: Positive for seizures and headaches. Negative for dizziness and weakness.   all other systems are negative except as noted in the HPI and PMH.     Physical Exam Updated Vital Signs BP (!) 158/110 (BP Location: Right Arm) Comment: hasnt taken bp meds in 4 days  Pulse 87   Temp 98.1 F (36.7 C) (Oral)   Resp 19   Ht 6' (1.829 m)   Wt 75.8 kg (167 lb)   SpO2 100%   BMI 22.65 kg/m   Physical Exam  Constitutional: He is oriented to person, place, and time. He appears well-developed and well-nourished. No distress.  HENT:  Head: Normocephalic and atraumatic.  Mouth/Throat: Oropharynx is clear and moist. No oropharyngeal exudate.  Eyes: Pupils are equal, round, and reactive to light. Conjunctivae and EOM are normal. Right eye exhibits no discharge. Left eye exhibits no discharge.  Neck: Normal range of motion. Neck supple.  No meningismus.  Cardiovascular: Normal rate, regular rhythm, normal heart sounds and intact distal pulses.  No murmur  heard. Pulmonary/Chest: Effort normal and breath sounds normal. No respiratory distress. He exhibits tenderness.  TTP axilla and posterior scapular areas bilaterally.  No rash.  Abdominal: Soft. There is tenderness. There is no rebound and no guarding.  Genitourinary:  Genitourinary Comments: No testicular tenderness  Musculoskeletal: Normal range of motion. He exhibits tenderness. He exhibits no edema.  Right paraspinal lumbar and SI joint tenderness.  No midline back tenderness  5/5 strength in bilateral lower extremities. Ankle plantar and dorsiflexion intact. Great toe extension intact bilaterally. +2 DP and PT pulses. +2 patellar reflexes bilaterally. Normal gait. Sensation intact bilaterally  Tenderness to right scapula that is worse with palpation and movement of right arm.  No rash  Multiple chronic cystic lesions across back they are not fluctuant or erythematous  Neurological: He is alert and oriented to person, place, and time. No cranial nerve deficit. He exhibits normal muscle tone. Coordination normal.  No ataxia on finger to nose bilaterally. No pronator drift. 5/5 strength throughout. CN 2-12 intact.Equal grip strength. Sensation intact.   Skin: Skin is warm. Capillary refill takes less than 2 seconds. No rash noted.  Psychiatric: He has a normal mood and affect. His behavior is normal.  Nursing note and vitals reviewed.    ED Treatments / Results  Labs (all labs ordered are listed, but only abnormal results are displayed) Labs Reviewed  URINALYSIS, ROUTINE W REFLEX MICROSCOPIC - Abnormal; Notable for the following components:      Result Value   Color, Urine STRAW (*)    All other components within normal limits  CBC WITH DIFFERENTIAL/PLATELET - Abnormal; Notable for the following components:   WBC 11.1 (*)    Neutro Abs 8.0 (*)    All other components within normal limits  COMPREHENSIVE METABOLIC PANEL - Abnormal; Notable for the following components:   Glucose,  Bld 103 (*)    Creatinine, Ser 0.60 (*)    AST 14 (*)    ALT 12 (*)    All other components within normal limits  LIPASE, BLOOD  D-DIMER, QUANTITATIVE (NOT AT [facility])  CK    EKG EKG Interpretation  Date/Time:  Saturday September 11 2017 08:08:59 EDT Ventricular Rate:  73 PR Interval:    QRS Duration: 90 QT Interval:  387 QTC Calculation: 427 R Axis:   24 Text Interpretation:  Sinus rhythm No significant change was found Confirmed by Ezequiel Essex [phone]) on 09/11/2017 8:13:57 AM   Radiology No results found.  Procedures Procedures (including critical care time)  Medications Ordered in ED Medications - No data to display   Initial Impression / Assessment and Plan / ED Course  I have reviewed the triage vital signs and the nursing notes.  Pertinent labs & imaging results that were available during my care of the patient were reviewed by me and considered in my medical decision making (see chart for details).    Patient with complex past medical history presenting with right-sided flank pain and abdominal pain that radiates down his right leg.  History notable for having appendectomy 1 month ago.  UA negative. No neuro deficits on exam. Does have history of cancer but no red flags on exam. Good strength and sensation in lower extremities with intact distal pulses. No midline back pain. Equal distal pulses.  R flank and abdominal pain may be related to recent appendectomy. Will obtain CT imaging.  Bilateral axillary and scapular pain since seizures last week. Will check D-dimer and evaluate for PE as well.  Labs delayed due to equipment dysfunction in lab.  Low suspicion for cord compression or cauda equina.   CTs and D-dimer pending at time of sign out to Dr. Lacinda Axon.  Final Clinical Impressions(s) / ED Diagnoses   Final diagnoses:  Right flank pain  H/O brain tumor    ED Discharge Orders    None       Maryan Sivak, Annie Main, MD 09/11/17 [phone]
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
## 2019-08-12 IMAGING — CT CT HEAD WO/W CM
3 series · 15 of 47 positions shown, 18 images · IV contrast (iopamidol)
Comparison: None.

CLINICAL DATA: Headache.  History of glioma with resection 1326

EXAM:
CT HEAD WITHOUT AND WITH CONTRAST
TECHNIQUE: Contiguous axial images were obtained from the base of the skull
through the vertex without and with intravenous contrast
CONTRAST:  100mL [id number] IOPAMIDOL ([id number]) INJECTION 76%

[Series 2: head w · axial · 0.44mm/px · z∈[+1479,+1619]mm · 9 of 34 slices shown, 12 images]
[im 3/34  brain]
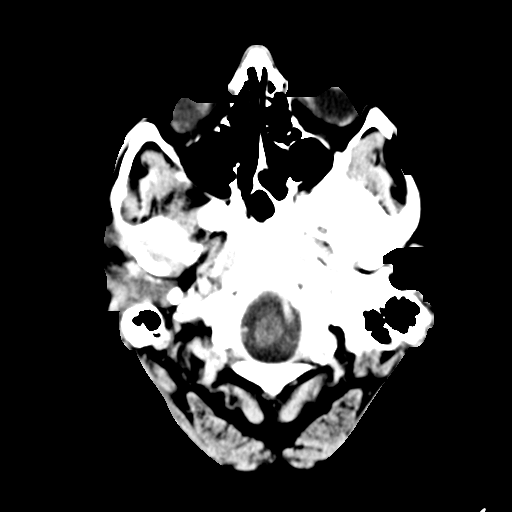
[im 3/34  bone]
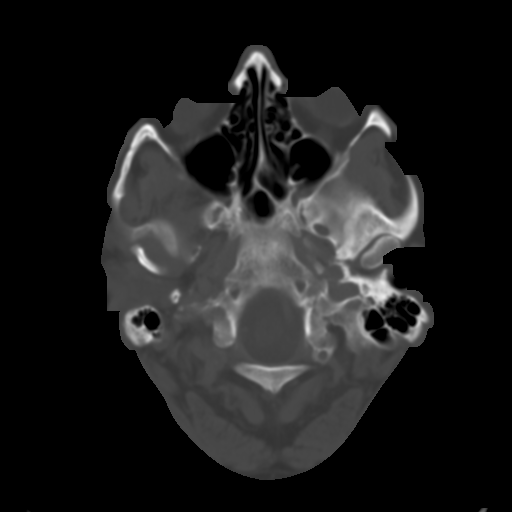
[im 6/34  brain]
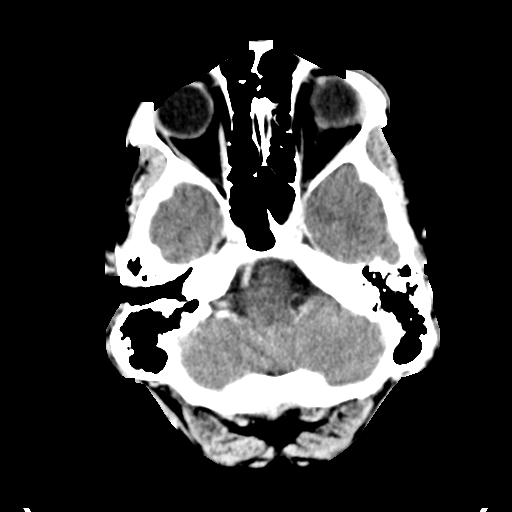
[im 10/34  brain]
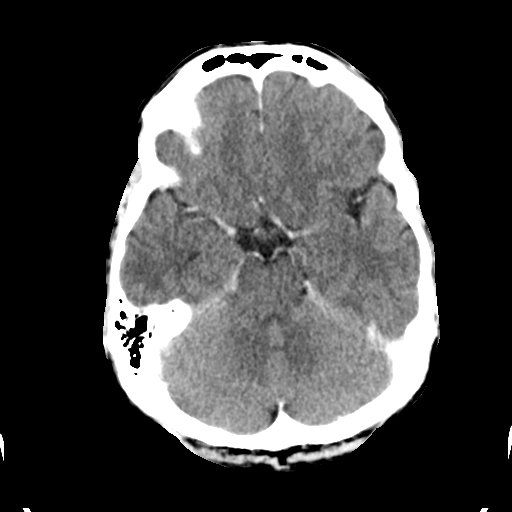
[im 13/34  brain]
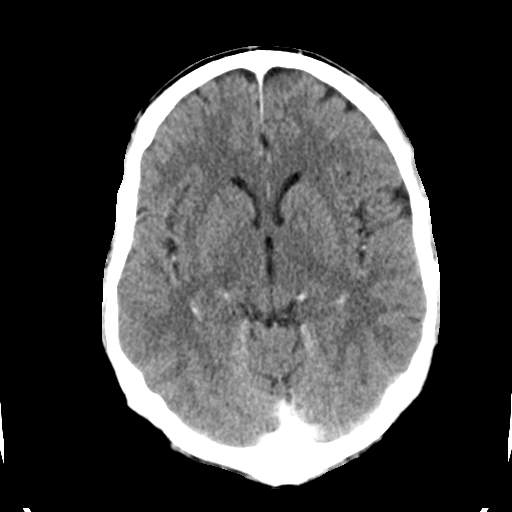
[im 18/34  brain]
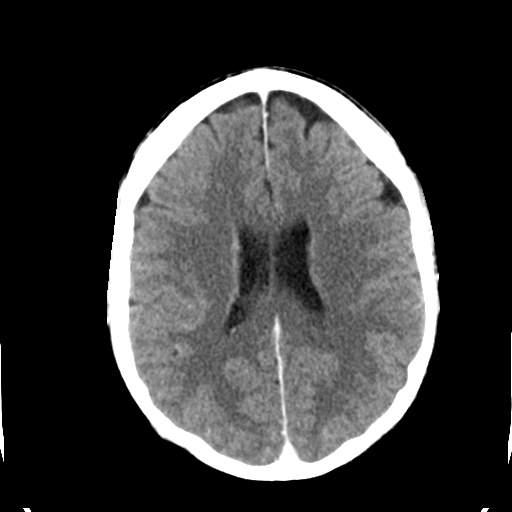
[im 18/34  bone]
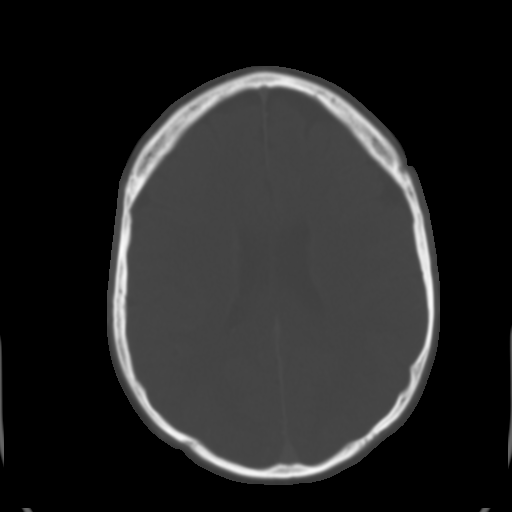
[im 21/34  brain]
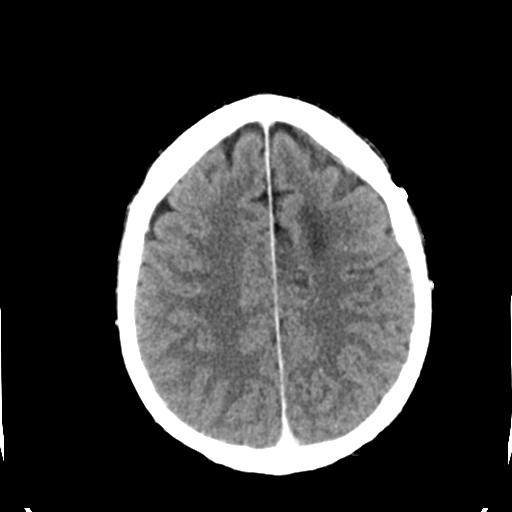
[im 24/34  brain]
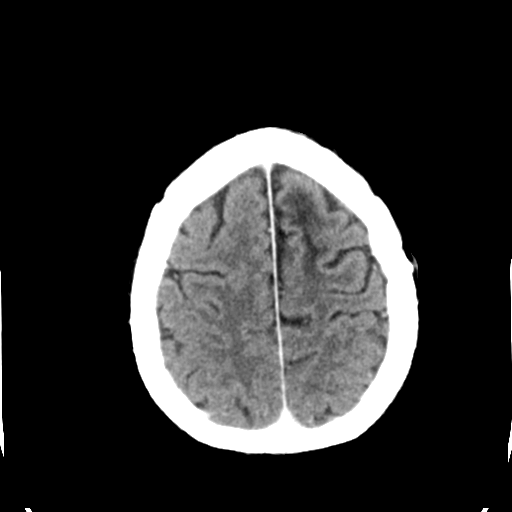
[im 28/34  brain]
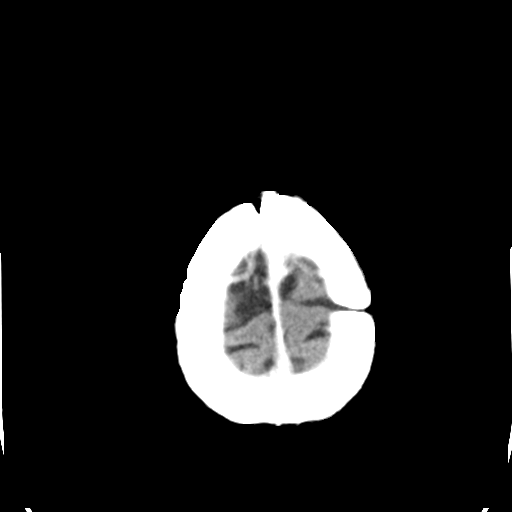
[im 31/34  brain]
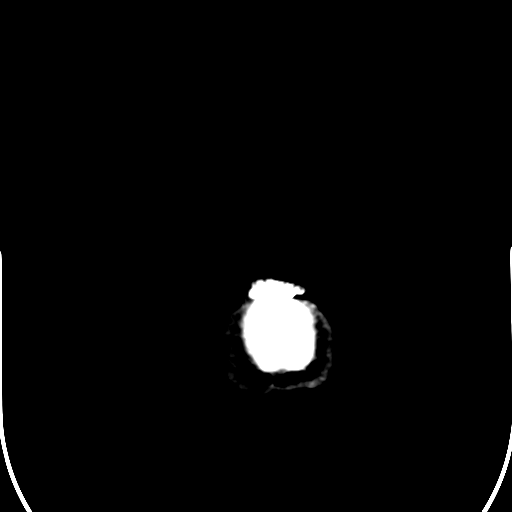
[im 31/34  bone]
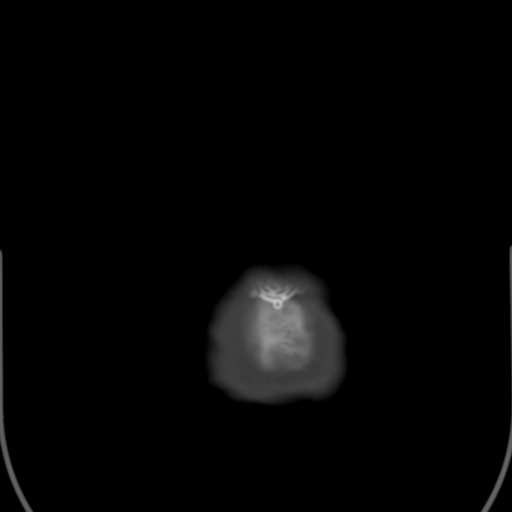

[Series 3: coronal soft tissue · coronal · 0.33mm/px · 3 of 75 slices shown]
[im 25/75  brain]
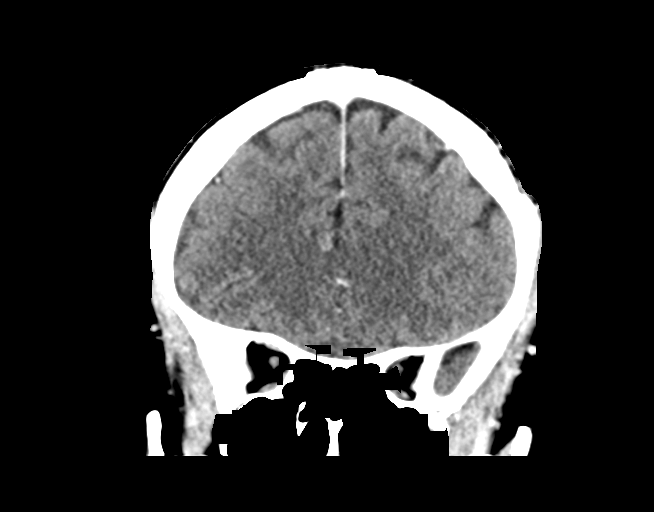
[im 33/75  brain]
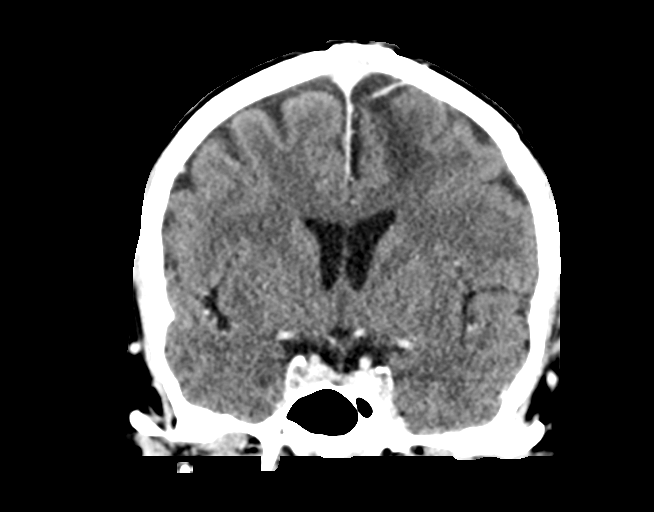
[im 42/75  brain]
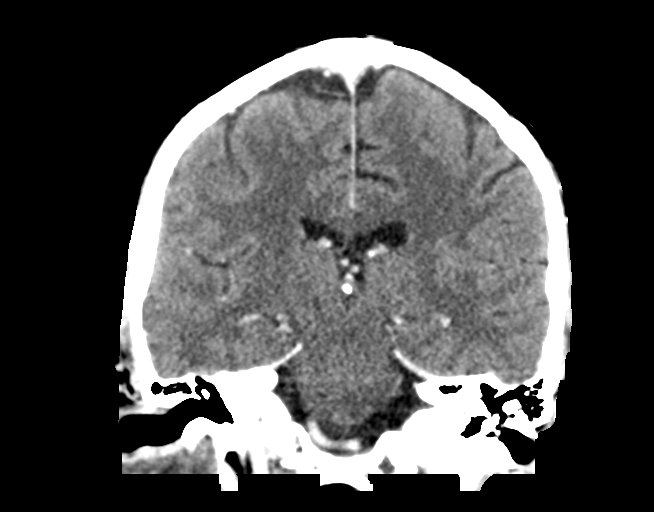

[Series 4: sagittal soft tissue · sagittal · 0.33mm/px · 3 of 67 slices shown]
[im 23/67  brain]
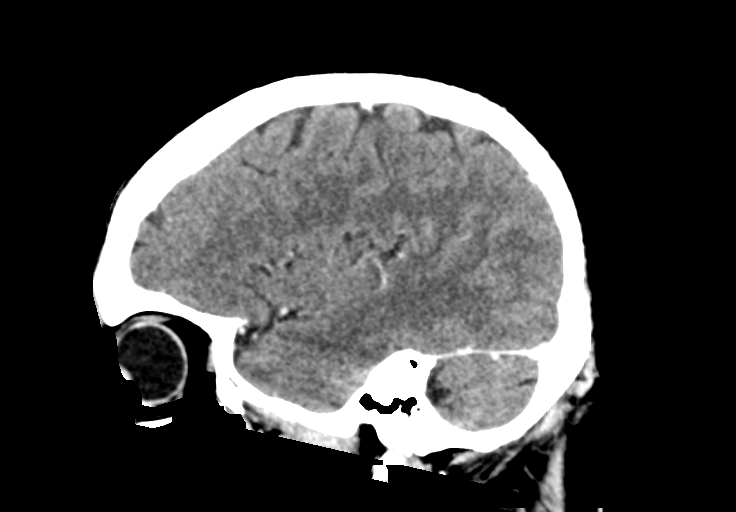
[im 34/67  brain]
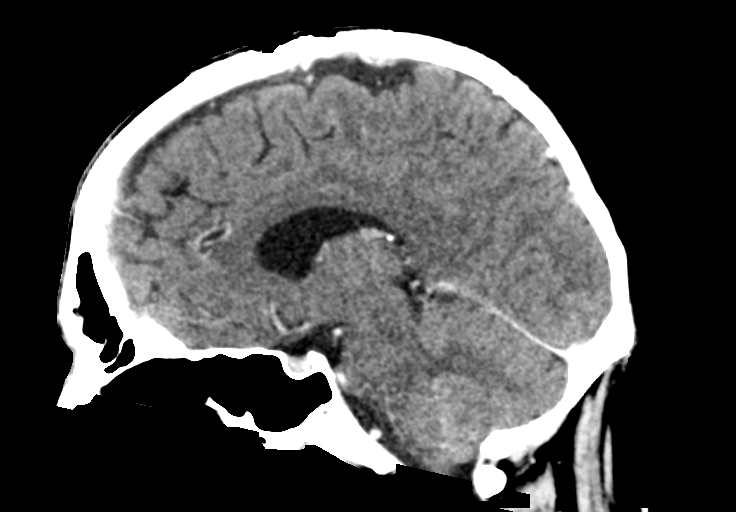
[im 45/67  brain]
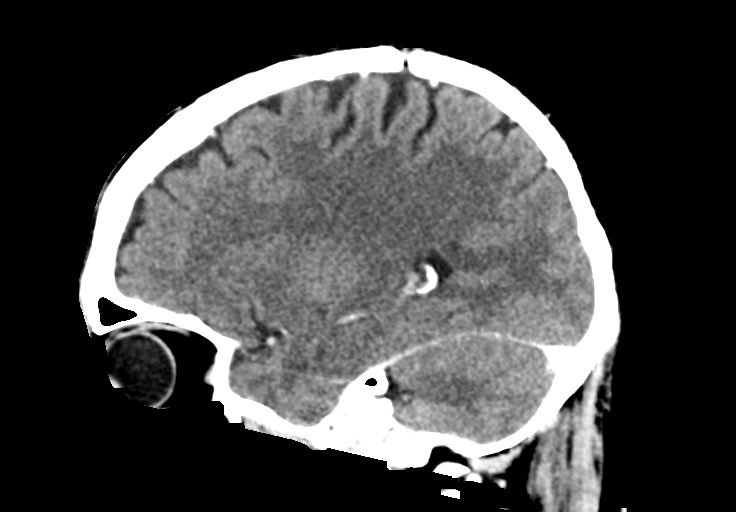

[15 of 47 positions shown; findings below may reference images not displayed]

FINDINGS: Brain: Left frontal craniotomy for tumor resection. Surgical
resection cavity in the high left frontal lobe without mass-effect
or edema. No enhancing tumor.

Ventricle size normal. Negative for acute infarct. Negative for
hemorrhage or fluid collection.

Vascular: Negative for hyperdense vessel

Skull: Left frontal craniotomy without complication.

Sinuses/Orbits: Negative

Other: None
IMPRESSION: No acute abnormality. Left frontal tumor resection without evidence
of recurrent tumor
